# Patient Record
Sex: Female | Born: 1935
Health system: Southern US, Community
[De-identification: ages and names within clinical notes are randomized; demographics above are authoritative.]

## PROBLEM LIST (undated history)

## (undated) DIAGNOSIS — I10 Essential (primary) hypertension: Secondary | ICD-10-CM

## (undated) DIAGNOSIS — E119 Type 2 diabetes mellitus without complications: Secondary | ICD-10-CM

## (undated) DIAGNOSIS — R112 Nausea with vomiting, unspecified: Secondary | ICD-10-CM

## (undated) DIAGNOSIS — Z9889 Other specified postprocedural states: Secondary | ICD-10-CM

## (undated) DIAGNOSIS — H9190 Unspecified hearing loss, unspecified ear: Secondary | ICD-10-CM

## (undated) HISTORY — PX: ABDOMINAL HYSTERECTOMY: SHX81

## (undated) HISTORY — PX: GANGLION CYST EXCISION: SHX1691

## (undated) HISTORY — PX: CHOLECYSTECTOMY: SHX55

---

## 2014-10-11 ENCOUNTER — Ambulatory Visit: Payer: Self-pay | Admitting: Internal Medicine

## 2015-04-12 ENCOUNTER — Encounter: Payer: Self-pay | Admitting: *Deleted

## 2015-04-18 ENCOUNTER — Ambulatory Visit: Payer: Medicare Other | Admitting: Anesthesiology

## 2015-04-18 ENCOUNTER — Encounter: Payer: Self-pay | Admitting: *Deleted

## 2015-04-18 ENCOUNTER — Ambulatory Visit
Admission: RE | Admit: 2015-04-18 | Discharge: 2015-04-18 | Disposition: A | Discharge status: Home / Self Care | Payer: Medicare Other | Source: Ambulatory Visit | Attending: Ophthalmology | Admitting: Ophthalmology

## 2015-04-18 ENCOUNTER — Encounter
Admission: RE | Disposition: A | Discharge status: Home / Self Care | Payer: Self-pay | Source: Ambulatory Visit | Attending: Ophthalmology

## 2015-04-18 DIAGNOSIS — H2511 Age-related nuclear cataract, right eye: Secondary | ICD-10-CM | POA: Diagnosis present

## 2015-04-18 DIAGNOSIS — I10 Essential (primary) hypertension: Secondary | ICD-10-CM | POA: Diagnosis not present

## 2015-04-18 DIAGNOSIS — E119 Type 2 diabetes mellitus without complications: Secondary | ICD-10-CM | POA: Insufficient documentation

## 2015-04-18 DIAGNOSIS — Z7982 Long term (current) use of aspirin: Secondary | ICD-10-CM | POA: Insufficient documentation

## 2015-04-18 HISTORY — DX: Essential (primary) hypertension: I10

## 2015-04-18 HISTORY — DX: Type 2 diabetes mellitus without complications: E11.9

## 2015-04-18 HISTORY — DX: Nausea with vomiting, unspecified: Z98.890

## 2015-04-18 HISTORY — DX: Other specified postprocedural states: R11.2

## 2015-04-18 HISTORY — DX: Unspecified hearing loss, unspecified ear: H91.90

## 2015-04-18 HISTORY — PX: CATARACT EXTRACTION W/PHACO: SHX586

## 2015-04-18 LAB — GLUCOSE, CAPILLARY: Glucose-Capillary: 136 mg/dL — ABNORMAL HIGH (ref 65–99)

## 2015-04-18 SURGERY — PHACOEMULSIFICATION, CATARACT, WITH IOL INSERTION
Anesthesia: Monitor Anesthesia Care | Laterality: Right | Wound class: Clean

## 2015-04-18 MED ORDER — MOXIFLOXACIN HCL 0.5 % OP SOLN
OPHTHALMIC | Status: DC | PRN
  Administered 2015-04-18: 2 [drp] via OPHTHALMIC

## 2015-04-18 MED ORDER — LIDOCAINE HCL (PF) 1 % IJ SOLN
INTRAMUSCULAR | Status: AC
  Filled 2015-04-18: qty 2

## 2015-04-18 MED ORDER — TETRACAINE HCL 0.5 % OP SOLN: 1.0000 [drp] | OPHTHALMIC | Status: DC | PRN

## 2015-04-18 MED ORDER — MOXIFLOXACIN HCL 0.5 % OP SOLN
OPHTHALMIC | Status: AC
  Filled 2015-04-18: qty 3

## 2015-04-18 MED ORDER — ARMC OPHTHALMIC DILATING GEL
1.0000 "application " | OPHTHALMIC | Status: AC | PRN
  Administered 2015-04-18 (×2): 1 via OPHTHALMIC

## 2015-04-18 MED ORDER — NA CHONDROIT SULF-NA HYALURON 40-17 MG/ML IO SOLN
INTRAOCULAR | Status: AC
  Filled 2015-04-18: qty 1

## 2015-04-18 MED ORDER — ARMC OPHTHALMIC DILATING GEL
OPHTHALMIC | Status: AC
  Administered 2015-04-18: 1 via OPHTHALMIC
  Filled 2015-04-18: qty 0.25

## 2015-04-18 MED ORDER — POVIDONE-IODINE 5 % OP SOLN
OPHTHALMIC | Status: AC
  Administered 2015-04-18: 1 via OPHTHALMIC
  Filled 2015-04-18: qty 30

## 2015-04-18 MED ORDER — NA CHONDROIT SULF-NA HYALURON 40-17 MG/ML IO SOLN
INTRAOCULAR | Status: DC | PRN
  Administered 2015-04-18: 1 mL via INTRAOCULAR

## 2015-04-18 MED ORDER — SODIUM CHLORIDE 0.9 % IV SOLN
INTRAVENOUS | Status: DC
  Administered 2015-04-18 (×2): via INTRAVENOUS

## 2015-04-18 MED ORDER — CARBACHOL 0.01 % IO SOLN
INTRAOCULAR | Status: DC | PRN
  Administered 2015-04-18: .5 mL via INTRAOCULAR

## 2015-04-18 MED ORDER — MOXIFLOXACIN HCL 0.5 % OP SOLN: 1.0000 [drp] | OPHTHALMIC | Status: DC | PRN

## 2015-04-18 MED ORDER — EPINEPHRINE HCL 1 MG/ML IJ SOLN
INTRAMUSCULAR | Status: DC | PRN
  Administered 2015-04-18: 250 mL via OPHTHALMIC

## 2015-04-18 MED ORDER — FENTANYL CITRATE (PF) 100 MCG/2ML IJ SOLN
INTRAMUSCULAR | Status: DC | PRN
  Administered 2015-04-18: 25 ug via INTRAVENOUS

## 2015-04-18 MED ORDER — TETRACAINE HCL 0.5 % OP SOLN
OPHTHALMIC | Status: AC
  Filled 2015-04-18: qty 2

## 2015-04-18 MED ORDER — CEFUROXIME OPHTHALMIC INJECTION 1 MG/0.1 ML
INJECTION | OPHTHALMIC | Status: AC
  Filled 2015-04-18: qty 0.1

## 2015-04-18 MED ORDER — CEFUROXIME OPHTHALMIC INJECTION 1 MG/0.1 ML
INJECTION | OPHTHALMIC | Status: DC | PRN
  Administered 2015-04-18: .1 mL via INTRACAMERAL

## 2015-04-18 MED ORDER — EPINEPHRINE HCL 1 MG/ML IJ SOLN
INTRAMUSCULAR | Status: AC
  Filled 2015-04-18: qty 1

## 2015-04-18 MED ORDER — POVIDONE-IODINE 5 % OP SOLN
1.0000 "application " | OPHTHALMIC | Status: AC | PRN
  Administered 2015-04-18: 1 via OPHTHALMIC

## 2015-04-18 SURGICAL SUPPLY — 22 items
CANNULA ANT/CHMB 27GA (MISCELLANEOUS) IMPLANT
CUP MEDICINE 2OZ PLAST GRAD ST (MISCELLANEOUS) IMPLANT
GLOVE BIO SURGEON STRL SZ8 (GLOVE) ×3 IMPLANT
GLOVE BIOGEL M 6.5 STRL (GLOVE) ×3 IMPLANT
GLOVE SURG LX 8.0 MICRO (GLOVE) ×2
GLOVE SURG LX STRL 8.0 MICRO (GLOVE) ×1 IMPLANT
GOWN STRL REUS W/ TWL LRG LVL3 (GOWN DISPOSABLE) ×2 IMPLANT
GOWN STRL REUS W/TWL LRG LVL3 (GOWN DISPOSABLE) ×4
LENS IOL TECNIS 22.5 (Intraocular Lens) ×3 IMPLANT
LENS IOL TECNIS MONO 1P 22.5 (Intraocular Lens) ×1 IMPLANT
PACK CATARACT (MISCELLANEOUS) ×3 IMPLANT
PACK CATARACT BRASINGTON LX (MISCELLANEOUS) ×3 IMPLANT
PACK EYE AFTER SURG (MISCELLANEOUS) ×3 IMPLANT
SOL BSS BAG (MISCELLANEOUS) ×3
SOL PREP PVP 2OZ (MISCELLANEOUS)
SOLUTION BSS BAG (MISCELLANEOUS) ×1 IMPLANT
SOLUTION PREP PVP 2OZ (MISCELLANEOUS) IMPLANT
SYR 3ML LL SCALE MARK (SYRINGE) ×3 IMPLANT
SYR 5ML LL (SYRINGE) ×3 IMPLANT
SYR TB 1ML 27GX1/2 LL (SYRINGE) ×3 IMPLANT
WATER STERILE IRR 1000ML POUR (IV SOLUTION) ×3 IMPLANT
WIPE NON LINTING 3.25X3.25 (MISCELLANEOUS) ×3 IMPLANT

## 2015-04-18 NOTE — Op Note (Signed)
PREOPERATIVE DIAGNOSIS:  Nuclear sclerotic cataract of the right eye.   POSTOPERATIVE DIAGNOSIS: NUCLEAR SCLEROTIC CATARACT RIGHT EYE   OPERATIVE PROCEDURE:  Procedure(s): CATARACT EXTRACTION PHACO AND INTRAOCULAR LENS PLACEMENT (IOC)   SURGEON:  Galen Manila, MD.   ANESTHESIA:  Anesthesiologist: Gijsbertus Georgana Curio, MD CRNA: Malva Cogan, CRNA; Chong Sicilian, CRNA  1.      Managed anesthesia care. 2.      Topical tetracaine drops followed by 2% Xylocaine jelly applied in the preoperative holding area.   COMPLICATIONS:  None.   TECHNIQUE:   Stop and chop   DESCRIPTION OF PROCEDURE:  The patient was examined and consented in the preoperative holding area where the aforementioned topical anesthesia was applied to the right eye and then brought back to the Operating Room where the right eye was prepped and draped in the usual sterile ophthalmic fashion and a lid speculum was placed. A paracentesis was created with the side port blade and the anterior chamber was filled with viscoelastic. A near clear corneal incision was performed with the steel keratome. A continuous curvilinear capsulorrhexis was performed with a cystotome followed by the capsulorrhexis forceps. Hydrodissection and hydrodelineation were carried out with BSS on a blunt cannula. The lens was removed in a stop and chop  technique and the remaining cortical material was removed with the irrigation-aspiration handpiece. The capsular bag was inflated with viscoelastic and the Technis ZCB00  lens was placed in the capsular bag without complication. The remaining viscoelastic was removed from the eye with the irrigation-aspiration handpiece. The wounds were hydrated. The anterior chamber was flushed with Miostat and the eye was inflated to physiologic pressure. 0.1 mL of cefuroxime concentration 10 mg/mL was placed in the anterior chamber. The wounds were found to be water tight. The eye was dressed with Vigamox. The patient  was given protective glasses to wear throughout the day and a shield with which to sleep tonight. The patient was also given drops with which to begin a drop regimen today and will follow-up with me in one day.  Implant Name Type Inv. Item Serial No. Manufacturer Lot No. LRB No. Used  LENS IMPL INTRAOC ZCB00 22.5 - [id number] Intraocular Lens LENS IMPL INTRAOC ZCB00 22.5 [id number]   Right 1   Procedure(s) with comments: CATARACT EXTRACTION PHACO AND INTRAOCULAR LENS PLACEMENT (IOC) (Right) - [place]   00:58.9 AP    24.1 CDE   14.19 casette lot#  Electronically signed: PORFILIO,WILLIAM LOUIS 04/18/2015 9:14 AM

## 2015-04-18 NOTE — Discharge Instructions (Signed)
Eye Surgery Discharge Instructions  Expect mild scratchy sensation or mild soreness. DO NOT RUB YOUR EYE!  The day of surgery:  Minimal physical activity, but bed rest is not required  No reading, computer work, or close hand work  No bending, lifting, or straining.  May watch TV  For 24 hours:  No driving, legal decisions, or alcoholic beverages  Safety precautions  Eat anything you prefer: It is better to start with liquids, then soup then solid foods.  _____ Eye patch should be worn until postoperative exam tomorrow.  ____ Solar shield eyeglasses should be worn for comfort in the sunlight/patch while sleeping  Resume all regular medications including aspirin or Coumadin if these were discontinued prior to surgery. You may shower, bathe, shave, or wash your hair. Tylenol may be taken for mild discomfort.  Call your doctor if you experience significant pain, nausea, or vomiting, fever > 101 or other signs of infection. [phone] or [phone] Specific instructions:  Follow-up Information    Follow up with Carlena Bjornstad, MD On 04/19/2015.   Specialty:  Ophthalmology   Why:  10:35   Contact information:   [address] [place] [address] [phone]

## 2015-04-18 NOTE — Anesthesia Preprocedure Evaluation (Signed)
Anesthesia Evaluation  Patient identified by MRN, date of birth, ID band Patient awake    Reviewed: Allergy & Precautions, NPO status , Patient's Chart, lab work & pertinent test results  Airway Mallampati: III       Dental no notable dental hx.    Pulmonary neg pulmonary ROS,    Pulmonary exam normal        Cardiovascular hypertension, Pt. on home beta blockers Normal cardiovascular exam     Neuro/Psych negative neurological ROS  negative psych ROS   GI/Hepatic negative GI ROS, Neg liver ROS,   Endo/Other  diabetes, Type 2, Oral Hypoglycemic Agents  Renal/GU negative Renal ROS     Musculoskeletal   Abdominal Normal abdominal exam  (+)   Peds  Hematology   Anesthesia Other Findings   Reproductive/Obstetrics                             Anesthesia Physical Anesthesia Plan  ASA: III  Anesthesia Plan: MAC   Post-op Pain Management:    Induction: Intravenous  Airway Management Planned: Nasal Cannula  Additional Equipment:   Intra-op Plan:   Post-operative Plan:   Informed Consent: I have reviewed the patients History and Physical, chart, labs and discussed the procedure including the risks, benefits and alternatives for the proposed anesthesia with the patient or authorized representative who has indicated his/her understanding and acceptance.     Plan Discussed with: CRNA  Anesthesia Plan Comments:         Anesthesia Quick Evaluation

## 2015-04-18 NOTE — Transfer of Care (Signed)
Immediate Anesthesia Transfer of Care Note  Patient: Amy Aguirre  Procedure(s) Performed: Procedure(s) with comments: CATARACT EXTRACTION PHACO AND INTRAOCULAR LENS PLACEMENT (IOC) (Right) - [place]   00:58.9 AP    24.1 CDE   14.19 casette lot#  Patient Location: Short Stay  Anesthesia Type:MAC  Level of Consciousness: awake, alert  and oriented  Airway & Oxygen Therapy: Patient Spontanous Breathing and Patient connected to nasal cannula oxygen  Post-op Assessment: Report given to RN and Post -op Vital signs reviewed and stable  Post vital signs: Reviewed and stable  Last Vitals: 100% 60hr 97.8 177/83 18resp Filed Vitals:   04/18/15 0743  BP: 209/68  Pulse: 60  Temp: 36.4 C  Resp: 16    Complications: No apparent anesthesia complications

## 2015-04-18 NOTE — Anesthesia Postprocedure Evaluation (Signed)
  Anesthesia Post-op Note  Patient: Amy Aguirre  Procedure(s) Performed: Procedure(s) with comments: CATARACT EXTRACTION PHACO AND INTRAOCULAR LENS PLACEMENT (IOC) (Right) - [place]   00:58.9 AP    24.1 CDE   14.19 casette lot#  Anesthesia type:MAC  Patient location: short stay  Post pain: Pain level controlled  Post assessment: Post-op Vital signs reviewed, Patient's Cardiovascular Status Stable, Respiratory Function Stable, Patent Airway and No signs of Nausea or vomiting  Post vital signs: Reviewed and stable  Last Vitals: 100% 18resp 97.8 177/83 60hr Filed Vitals:   04/18/15 0743  BP: 209/68  Pulse: 60  Temp: 36.4 C  Resp: 16    Level of consciousness: awake, alert  and patient cooperative  Complications: No apparent anesthesia complications

## 2015-04-18 NOTE — H&P (Signed)
  All labs reviewed. Abnormal studies sent to patients PCP when indicated.  Previous H&P reviewed, patient examined, there are NO CHANGES.  PORFILIO,WILLIAM LOUIS9/20/20168:46 AM

## 2017-11-10 ENCOUNTER — Inpatient Hospital Stay
Admission: AD | Admit: 2017-11-10 | Payer: Self-pay | Source: Other Acute Inpatient Hospital | Admitting: General Surgery

## 2017-11-10 ENCOUNTER — Observation Stay (HOSPITAL_COMMUNITY)
Admission: EM | Admit: 2017-11-10 | Discharge: 2017-11-13 | Disposition: A | Discharge status: Home / Self Care | Payer: Medicare Other | Attending: Physician Assistant | Admitting: Physician Assistant

## 2017-11-10 ENCOUNTER — Other Ambulatory Visit: Payer: Self-pay

## 2017-11-10 ENCOUNTER — Emergency Department
Admission: EM | Admit: 2017-11-10 | Discharge: 2017-11-10 | Disposition: A | Discharge status: Other Acute Inpatient Hospital | Payer: Medicare Other | Attending: Emergency Medicine | Admitting: Emergency Medicine

## 2017-11-10 ENCOUNTER — Emergency Department: Payer: Medicare Other

## 2017-11-10 ENCOUNTER — Encounter: Payer: Self-pay | Admitting: Medical Oncology

## 2017-11-10 DIAGNOSIS — H8110 Benign paroxysmal vertigo, unspecified ear: Secondary | ICD-10-CM | POA: Diagnosis not present

## 2017-11-10 DIAGNOSIS — E119 Type 2 diabetes mellitus without complications: Secondary | ICD-10-CM | POA: Diagnosis not present

## 2017-11-10 DIAGNOSIS — Z9841 Cataract extraction status, right eye: Secondary | ICD-10-CM | POA: Diagnosis not present

## 2017-11-10 DIAGNOSIS — H9190 Unspecified hearing loss, unspecified ear: Secondary | ICD-10-CM | POA: Insufficient documentation

## 2017-11-10 DIAGNOSIS — Y998 Other external cause status: Secondary | ICD-10-CM | POA: Insufficient documentation

## 2017-11-10 DIAGNOSIS — S0211HA Other fracture of occiput, left side, initial encounter for closed fracture: Secondary | ICD-10-CM | POA: Diagnosis not present

## 2017-11-10 DIAGNOSIS — Z7982 Long term (current) use of aspirin: Secondary | ICD-10-CM | POA: Insufficient documentation

## 2017-11-10 DIAGNOSIS — R2681 Unsteadiness on feet: Secondary | ICD-10-CM | POA: Insufficient documentation

## 2017-11-10 DIAGNOSIS — Z79899 Other long term (current) drug therapy: Secondary | ICD-10-CM | POA: Insufficient documentation

## 2017-11-10 DIAGNOSIS — Z7984 Long term (current) use of oral hypoglycemic drugs: Secondary | ICD-10-CM | POA: Insufficient documentation

## 2017-11-10 DIAGNOSIS — Y9289 Other specified places as the place of occurrence of the external cause: Secondary | ICD-10-CM | POA: Diagnosis not present

## 2017-11-10 DIAGNOSIS — R269 Unspecified abnormalities of gait and mobility: Secondary | ICD-10-CM | POA: Insufficient documentation

## 2017-11-10 DIAGNOSIS — M4050 Lordosis, unspecified, site unspecified: Secondary | ICD-10-CM | POA: Diagnosis not present

## 2017-11-10 DIAGNOSIS — S065X0A Traumatic subdural hemorrhage without loss of consciousness, initial encounter: Secondary | ICD-10-CM | POA: Insufficient documentation

## 2017-11-10 DIAGNOSIS — Z9049 Acquired absence of other specified parts of digestive tract: Secondary | ICD-10-CM | POA: Insufficient documentation

## 2017-11-10 DIAGNOSIS — W1839XA Other fall on same level, initial encounter: Secondary | ICD-10-CM | POA: Diagnosis not present

## 2017-11-10 DIAGNOSIS — Z90710 Acquired absence of both cervix and uterus: Secondary | ICD-10-CM | POA: Insufficient documentation

## 2017-11-10 DIAGNOSIS — I10 Essential (primary) hypertension: Secondary | ICD-10-CM | POA: Insufficient documentation

## 2017-11-10 DIAGNOSIS — H547 Unspecified visual loss: Secondary | ICD-10-CM | POA: Insufficient documentation

## 2017-11-10 DIAGNOSIS — Y929 Unspecified place or not applicable: Secondary | ICD-10-CM | POA: Insufficient documentation

## 2017-11-10 DIAGNOSIS — Y9389 Activity, other specified: Secondary | ICD-10-CM | POA: Insufficient documentation

## 2017-11-10 DIAGNOSIS — W19XXXD Unspecified fall, subsequent encounter: Secondary | ICD-10-CM

## 2017-11-10 DIAGNOSIS — S065X9A Traumatic subdural hemorrhage with loss of consciousness of unspecified duration, initial encounter: Secondary | ICD-10-CM | POA: Diagnosis present

## 2017-11-10 DIAGNOSIS — I6200 Nontraumatic subdural hemorrhage, unspecified: Secondary | ICD-10-CM

## 2017-11-10 DIAGNOSIS — I951 Orthostatic hypotension: Secondary | ICD-10-CM | POA: Insufficient documentation

## 2017-11-10 DIAGNOSIS — S098XXA Other specified injuries of head, initial encounter: Secondary | ICD-10-CM | POA: Diagnosis present

## 2017-11-10 DIAGNOSIS — W19XXXA Unspecified fall, initial encounter: Secondary | ICD-10-CM | POA: Diagnosis present

## 2017-11-10 DIAGNOSIS — S066X0A Traumatic subarachnoid hemorrhage without loss of consciousness, initial encounter: Secondary | ICD-10-CM | POA: Insufficient documentation

## 2017-11-10 DIAGNOSIS — Z961 Presence of intraocular lens: Secondary | ICD-10-CM | POA: Diagnosis not present

## 2017-11-10 DIAGNOSIS — Y999 Unspecified external cause status: Secondary | ICD-10-CM | POA: Insufficient documentation

## 2017-11-10 DIAGNOSIS — S02119A Unspecified fracture of occiput, initial encounter for closed fracture: Secondary | ICD-10-CM | POA: Diagnosis not present

## 2017-11-10 DIAGNOSIS — S065XAA Traumatic subdural hemorrhage with loss of consciousness status unknown, initial encounter: Secondary | ICD-10-CM

## 2017-11-10 LAB — BASIC METABOLIC PANEL
Anion gap: 12 (ref 5–15)
BUN: 26 mg/dL — AB (ref 6–20)
CALCIUM: 9.6 mg/dL (ref 8.9–10.3)
CO2: 20 mmol/L — AB (ref 22–32)
Chloride: 105 mmol/L (ref 101–111)
Creatinine, Ser: 1.23 mg/dL — ABNORMAL HIGH (ref 0.44–1.00)
GFR calc non Af Amer: 37 mL/min — ABNORMAL LOW (ref >60)
GFR, EST AFRICAN AMERICAN: 43 mL/min — AB (ref >60)
GLUCOSE: 196 mg/dL — AB (ref 65–99)
Potassium: 3.7 mmol/L (ref 3.5–5.1)
Sodium: 137 mmol/L (ref 135–145)

## 2017-11-10 LAB — CBC WITH DIFFERENTIAL/PLATELET
BASOS PCT: 0 %
Basophils Absolute: 0 10*3/uL (ref 0–0.1)
EOS ABS: 0 10*3/uL (ref 0–0.7)
EOS PCT: 0 %
HCT: 33.4 % — ABNORMAL LOW (ref 35.0–47.0)
Hemoglobin: 11.2 g/dL — ABNORMAL LOW (ref 12.0–16.0)
Lymphocytes Relative: 12 %
Lymphs Abs: 1.3 10*3/uL (ref 1.0–3.6)
MCH: 34 pg (ref 26.0–34.0)
MCHC: 33.5 g/dL (ref 32.0–36.0)
MCV: 101.4 fL — ABNORMAL HIGH (ref 80.0–100.0)
MONO ABS: 0.5 10*3/uL (ref 0.2–0.9)
MONOS PCT: 4 %
Neutro Abs: 9.5 10*3/uL — ABNORMAL HIGH (ref 1.4–6.5)
Neutrophils Relative %: 84 %
Platelets: 213 10*3/uL (ref 150–440)
RBC: 3.29 MIL/uL — ABNORMAL LOW (ref 3.80–5.20)
RDW: 14 % (ref 11.5–14.5)
WBC: 11.3 10*3/uL — ABNORMAL HIGH (ref 3.6–11.0)

## 2017-11-10 LAB — TYPE AND SCREEN
ABO/RH(D): B POS
Antibody Screen: NEGATIVE

## 2017-11-10 LAB — GLUCOSE, CAPILLARY: GLUCOSE-CAPILLARY: 178 mg/dL — AB (ref 65–99)

## 2017-11-10 LAB — PROTIME-INR
INR: 1.09
PROTHROMBIN TIME: 14 s (ref 11.4–15.2)

## 2017-11-10 MED ORDER — ONDANSETRON HCL 4 MG/2ML IJ SOLN
4.0000 mg | Freq: Once | INTRAMUSCULAR | Status: AC
  Administered 2017-11-10: 4 mg via INTRAVENOUS

## 2017-11-10 MED ORDER — LABETALOL HCL 5 MG/ML IV SOLN
10.0000 mg | Freq: Once | INTRAVENOUS | Status: DC
  Filled 2017-11-10: qty 4

## 2017-11-10 MED ORDER — INDAPAMIDE 2.5 MG PO TABS
2.5000 mg | ORAL_TABLET | Freq: Every day | ORAL | Status: DC
  Administered 2017-11-11 – 2017-11-13 (×3): 2.5 mg via ORAL
  Filled 2017-11-10 (×3): qty 1

## 2017-11-10 MED ORDER — HYDRALAZINE HCL 20 MG/ML IJ SOLN
10.0000 mg | INTRAMUSCULAR | Status: DC | PRN
  Administered 2017-11-11: 10 mg via INTRAVENOUS
  Filled 2017-11-10 (×2): qty 1

## 2017-11-10 MED ORDER — GLIMEPIRIDE 2 MG PO TABS
2.0000 mg | ORAL_TABLET | ORAL | Status: DC
  Administered 2017-11-12: 2 mg via ORAL
  Filled 2017-11-10 (×2): qty 1

## 2017-11-10 MED ORDER — POTASSIUM CHLORIDE CRYS ER 10 MEQ PO TBCR
10.0000 meq | EXTENDED_RELEASE_TABLET | Freq: Every day | ORAL | Status: DC
  Administered 2017-11-11 – 2017-11-13 (×3): 10 meq via ORAL
  Filled 2017-11-10 (×3): qty 1

## 2017-11-10 MED ORDER — IRBESARTAN 150 MG PO TABS
150.0000 mg | ORAL_TABLET | Freq: Every day | ORAL | Status: DC
  Administered 2017-11-11 – 2017-11-13 (×3): 150 mg via ORAL
  Filled 2017-11-10 (×3): qty 1

## 2017-11-10 MED ORDER — ONDANSETRON HCL 4 MG/2ML IJ SOLN
INTRAMUSCULAR | Status: AC
  Filled 2017-11-10: qty 2

## 2017-11-10 MED ORDER — ATENOLOL 100 MG PO TABS
100.0000 mg | ORAL_TABLET | Freq: Every day | ORAL | Status: DC
  Administered 2017-11-11 – 2017-11-13 (×3): 100 mg via ORAL
  Filled 2017-11-10 (×2): qty 1
  Filled 2017-11-10: qty 2

## 2017-11-10 MED ORDER — INSULIN ASPART 100 UNIT/ML ~~LOC~~ SOLN
0.0000 [IU] | Freq: Three times a day (TID) | SUBCUTANEOUS | Status: DC
  Administered 2017-11-11 – 2017-11-12 (×2): 2 [IU] via SUBCUTANEOUS
  Administered 2017-11-13: 3 [IU] via SUBCUTANEOUS

## 2017-11-10 MED ORDER — SIMVASTATIN 40 MG PO TABS
40.0000 mg | ORAL_TABLET | Freq: Every day | ORAL | Status: DC
  Administered 2017-11-11 – 2017-11-13 (×3): 40 mg via ORAL
  Filled 2017-11-10 (×3): qty 1

## 2017-11-10 MED ORDER — SODIUM CHLORIDE 0.9 % IV SOLN
1000.0000 mg | Freq: Once | INTRAVENOUS | Status: AC
  Administered 2017-11-10: 1000 mg via INTRAVENOUS
  Filled 2017-11-10: qty 10

## 2017-11-10 MED ORDER — DOCUSATE SODIUM 100 MG PO CAPS
100.0000 mg | ORAL_CAPSULE | Freq: Two times a day (BID) | ORAL | Status: DC
  Administered 2017-11-11 – 2017-11-13 (×5): 100 mg via ORAL
  Filled 2017-11-10 (×5): qty 1

## 2017-11-10 MED ORDER — ACETAMINOPHEN 325 MG PO TABS
650.0000 mg | ORAL_TABLET | ORAL | Status: DC | PRN
  Administered 2017-11-11: 650 mg via ORAL
  Filled 2017-11-10: qty 2

## 2017-11-10 MED ORDER — ONDANSETRON 4 MG PO TBDP: 4.0000 mg | ORAL_TABLET | Freq: Four times a day (QID) | ORAL | Status: DC | PRN

## 2017-11-10 MED ORDER — MORPHINE SULFATE (PF) 4 MG/ML IV SOLN: 1.0000 mg | INTRAVENOUS | Status: DC | PRN

## 2017-11-10 MED ORDER — ONDANSETRON HCL 4 MG/2ML IJ SOLN
4.0000 mg | Freq: Four times a day (QID) | INTRAMUSCULAR | Status: DC | PRN
  Administered 2017-11-11: 4 mg via INTRAVENOUS
  Filled 2017-11-10: qty 2

## 2017-11-10 MED ORDER — TRAMADOL HCL 50 MG PO TABS: 50.0000 mg | ORAL_TABLET | Freq: Two times a day (BID) | ORAL | Status: DC | PRN

## 2017-11-10 NOTE — H&P (Signed)
History   Amy Aguirre is an 84 y.o. female.   Chief Complaint:  Chief Complaint  Patient presents with  . Head Injury    Pt is a lovely 84 yo F who fell at home today trying to pick up a bucket.  She knows she didn't stand all the way back up, but doesn't recall losing her balance or getting dizzy.  She does know that she fell backwards and hit her elbows and the back of her head.  She did not pass out.  She does not feel dizzy now. She denies visual changes. She does have a headache "across her eyes."  She denies nausea or vomiting.  She denies chest pain or shortness of breath. She has significant hypertension.  She was taken to the [facility] ED by her daughter.  She was found to have a skull fx and head bleed.  One of her other daughters works here and she desired transfer to  [facility].     Past Medical History:  Diagnosis Date  . Diabetes mellitus without complication ([place])   . HOH (hard of hearing)   . Hypertension   . PONV (postoperative nausea and vomiting)     Past Surgical History:  Procedure Laterality Date  . ABDOMINAL HYSTERECTOMY    . CATARACT EXTRACTION W/PHACO Right 04/18/2015   Procedure: CATARACT EXTRACTION PHACO AND INTRAOCULAR LENS PLACEMENT (IOC);  Surgeon: Birder Robson, MD;  Location: [facility] ORS;  Service: Ophthalmology;  Laterality: Right;  [place]   00:58.9 AP    24.1 CDE   14.19 casette lot#  . CHOLECYSTECTOMY    . GANGLION CYST EXCISION      No family history on file. Social History:  reports that she has never smoked. She does not have any smokeless tobacco history on file. She reports that she does not drink alcohol. Her drug history is not on file.  Allergies  No Known Allergies  Home Medications   (Not in a hospital admission)  Trauma Course   Results for orders placed or performed during the hospital encounter of 11/10/17 (from the past 48 hour(s))  Basic metabolic panel     Status: Abnormal   Collection Time: 11/10/17  3:17 PM  Result Value Ref  Range   Sodium 137 135 - 145 mmol/L   Potassium 3.7 3.5 - 5.1 mmol/L   Chloride 105 101 - 111 mmol/L   CO2 20 (L) 22 - 32 mmol/L   Glucose, Bld 196 (H) 65 - 99 mg/dL   BUN 26 (H) 6 - 20 mg/dL   Creatinine, Ser 1.23 (H) 0.44 - 1.00 mg/dL   Calcium 9.6 8.9 - 10.3 mg/dL   GFR calc non Af Amer 37 (L) >60 mL/min   GFR calc Af Amer 43 (L) >60 mL/min    Comment: (NOTE) The eGFR has been calculated using the CKD EPI equation. This calculation has not been validated in all clinical situations. eGFR's persistently <60 mL/min signify possible Chronic Kidney Disease.    Anion gap 12 5 - 15    Comment: Performed at [facility], [address] [place], [place] [address]  CBC with Differential     Status: Abnormal   Collection Time: 11/10/17  3:17 PM  Result Value Ref Range   WBC 11.3 (H) 3.6 - 11.0 K/uL   RBC 3.29 (L) 3.80 - 5.20 MIL/uL   Hemoglobin 11.2 (L) 12.0 - 16.0 g/dL   HCT 33.4 (L) 35.0 - 47.0 %   MCV 101.4 (H) 80.0 -  100.0 fL   MCH 34.0 26.0 - 34.0 pg   MCHC 33.5 32.0 - 36.0 g/dL   RDW 14.0 11.5 - 14.5 %   Platelets 213 150 - 440 K/uL   Neutrophils Relative % 84 %   Neutro Abs 9.5 (H) 1.4 - 6.5 K/uL   Lymphocytes Relative 12 %   Lymphs Abs 1.3 1.0 - 3.6 K/uL   Monocytes Relative 4 %   Monocytes Absolute 0.5 0.2 - 0.9 K/uL   Eosinophils Relative 0 %   Eosinophils Absolute 0.0 0 - 0.7 K/uL   Basophils Relative 0 %   Basophils Absolute 0.0 0 - 0.1 K/uL    Comment: Performed at [facility], [address] [place], [place] [address]  Protime-INR     Status: None   Collection Time: 11/10/17  3:17 PM  Result Value Ref Range   Prothrombin Time 14.0 11.4 - 15.2 seconds   INR 1.09     Comment: Performed at [facility], [address] [place], [place] [address]  Type and screen Ordered by PROVIDER DEFAULT     Status: None   Collection Time: 11/10/17  4:08 PM  Result Value Ref Range   ABO/RH(D) B POS    Antibody Screen NEG    Sample Expiration       11/13/2017 Performed at [facility], [address] [place], [place] [address]    Ct Head Wo Contrast  Result Date: 11/10/2017 CLINICAL DATA:  [place] and struck head. EXAM: CT HEAD WITHOUT CONTRAST CT CERVICAL SPINE WITHOUT CONTRAST TECHNIQUE: Multidetector CT imaging of the head and cervical spine was performed following the standard protocol without intravenous contrast. Multiplanar CT image reconstructions of the cervical spine were also generated. COMPARISON:  None. FINDINGS: CT HEAD FINDINGS Brain: A right frontoparietal acute to subacute subdural hematoma measures 3 mm in thickness and is associated with acute subarachnoid hemorrhage in the right temporal lobe. Low attenuation in the inferior right temporal lobe may be related to contusion. There is also low attenuation and subarachnoid hemorrhage in the posterior left cerebellum suggesting contusion. No substantial midline shift. No hydrocephalus. Atrophy appears age-appropriate. No focal or discrete intra-axial mass lesion evident. Vascular: Atherosclerotic calcification of the carotid siphons noted at the skull base. No dense MCA sign. Skull: Left occipital skull fracture extends from the foramen magnum up almost to the apex of the lambdoid suture. Sinuses/Orbits: The visualized paranasal sinuses and mastoid air cells are clear. Visualized portions of the globes and intraorbital fat are unremarkable. Other: Large left posterior parietooccipital scalp contusion evident. CT CERVICAL SPINE FINDINGS Alignment: Straightening of normal cervical lordosis. Skull base and vertebrae: No acute fracture. No primary bone lesion or focal pathologic process. Soft tissues and spinal canal: No prevertebral fluid or swelling. Acute hemorrhage is visible in the CSF space dependently at the level of the foramen magnum, tracking down into the craniocervical junction. Disc levels: Loss of disc height noted at C5-6. Trace anterolisthesis of C4 on 5 and C6 on 7  is compatible with the facet degeneration at these levels. Upper chest: Unremarkable. Other: None. IMPRESSION: 1. Small right frontal parietal acute to subacute subdural hematoma associated with subarachnoid hemorrhage and probable contusion in the inferior right temporal lobe. There is associated subarachnoid hemorrhage/contusion in the posterior left cerebellum with blood products layering dependently in the CSF space of the craniocervical junction. No substantial midline shift. 2. Nondisplaced left occipital skull fracture. 3. Degenerative changes in the cervical spine without fracture. 4. Loss of  cervical lordosis. This can be related to patient positioning, muscle spasm or soft tissue injury. 5. Posterior left parietooccipital scalp contusion. Critical Value/emergent results were called by telephone at the time of interpretation on 11/10/2017 at 3:19 pm to Dr. Toney Rakes, who verbally acknowledged these results. Electronically Signed   By: Misty Stanley M.D.   On: 11/10/2017 15:21   Ct Cervical Spine Wo Contrast  Result Date: 11/10/2017 CLINICAL DATA:  [place] and struck head. EXAM: CT HEAD WITHOUT CONTRAST CT CERVICAL SPINE WITHOUT CONTRAST TECHNIQUE: Multidetector CT imaging of the head and cervical spine was performed following the standard protocol without intravenous contrast. Multiplanar CT image reconstructions of the cervical spine were also generated. COMPARISON:  None. FINDINGS: CT HEAD FINDINGS Brain: A right frontoparietal acute to subacute subdural hematoma measures 3 mm in thickness and is associated with acute subarachnoid hemorrhage in the right temporal lobe. Low attenuation in the inferior right temporal lobe may be related to contusion. There is also low attenuation and subarachnoid hemorrhage in the posterior left cerebellum suggesting contusion. No substantial midline shift. No hydrocephalus. Atrophy appears age-appropriate. No focal or discrete intra-axial mass lesion evident. Vascular:  Atherosclerotic calcification of the carotid siphons noted at the skull base. No dense MCA sign. Skull: Left occipital skull fracture extends from the foramen magnum up almost to the apex of the lambdoid suture. Sinuses/Orbits: The visualized paranasal sinuses and mastoid air cells are clear. Visualized portions of the globes and intraorbital fat are unremarkable. Other: Large left posterior parietooccipital scalp contusion evident. CT CERVICAL SPINE FINDINGS Alignment: Straightening of normal cervical lordosis. Skull base and vertebrae: No acute fracture. No primary bone lesion or focal pathologic process. Soft tissues and spinal canal: No prevertebral fluid or swelling. Acute hemorrhage is visible in the CSF space dependently at the level of the foramen magnum, tracking down into the craniocervical junction. Disc levels: Loss of disc height noted at C5-6. Trace anterolisthesis of C4 on 5 and C6 on 7 is compatible with the facet degeneration at these levels. Upper chest: Unremarkable. Other: None. IMPRESSION: 1. Small right frontal parietal acute to subacute subdural hematoma associated with subarachnoid hemorrhage and probable contusion in the inferior right temporal lobe. There is associated subarachnoid hemorrhage/contusion in the posterior left cerebellum with blood products layering dependently in the CSF space of the craniocervical junction. No substantial midline shift. 2. Nondisplaced left occipital skull fracture. 3. Degenerative changes in the cervical spine without fracture. 4. Loss of cervical lordosis. This can be related to patient positioning, muscle spasm or soft tissue injury. 5. Posterior left parietooccipital scalp contusion. Critical Value/emergent results were called by telephone at the time of interpretation on 11/10/2017 at 3:19 pm to Dr. Toney Rakes, who verbally acknowledged these results. Electronically Signed   By: Misty Stanley M.D.   On: 11/10/2017 15:21    Review of Systems   Constitutional: Negative.   HENT: Negative.   Eyes: Negative.   Respiratory: Negative.   Cardiovascular: Negative.        High blood pressure  Gastrointestinal: Negative.   Genitourinary: Negative.   Musculoskeletal:       Elbows are sore  Skin: Negative.   Neurological: Positive for headaches.  Endo/Heme/Allergies: Negative.   Psychiatric/Behavioral: Negative.   All other systems reviewed and are negative.   Blood pressure (!) 183/77, pulse 80, temperature 98.4 F (36.9 C), temperature source Oral, SpO2 100 %. Physical Exam  Constitutional: She is oriented to person, place, and time. She appears well-developed and well-nourished. No distress.  HENT:  Head: Normocephalic.  Right Ear: External ear normal.  Left Ear: External ear normal.  Mouth/Throat: Oropharynx is clear and moist.  Small hematoma on left posterior scalp  Eyes: Pupils are equal, round, and reactive to light. Conjunctivae and EOM are normal. Right eye exhibits no discharge. Left eye exhibits no discharge. No scleral icterus.  Neck: Normal range of motion. Neck supple. No JVD present. No tracheal deviation present. No thyromegaly present.  Cardiovascular: Normal rate, regular rhythm, normal heart sounds and intact distal pulses. Exam reveals no gallop and no friction rub.  No murmur heard. Respiratory: Effort normal and breath sounds normal. No respiratory distress. She has no wheezes. She has no rales. She exhibits no tenderness.  GI: Soft. Bowel sounds are normal. She exhibits no distension. There is no tenderness. There is no rebound and no guarding.  Musculoskeletal: Normal range of motion. She exhibits tenderness (superficial bruising and abrasion on bilateral elbows) and deformity (heberden's nodes c/w OA of hands.). She exhibits no edema.  Lymphadenopathy:    She has no cervical adenopathy.  Neurological: She is alert and oriented to person, place, and time. She displays normal reflexes. No cranial nerve  deficit (other than CN VIII). Coordination normal.  Hard of hearing  Skin: Skin is warm and dry. No rash noted. She is not diaphoretic. No erythema. No pallor.  Psychiatric: She has a normal mood and affect. Her behavior is normal. Judgment and thought content normal.     Assessment/Plan Fall Occipital skull fracture Small SDH right Small SAH Left scalp contusion HTN DM  Admit for observation in 4N ICU or 4N stepdown Neuro checks Diabetic diet, home amaryl, SSI. Recheck labs in AM Permissive slight hypertension Most of home meds Probably home tomorrow after therapies.   Neurosurg consult.  Stark Klein 11/10/2017, 8:12 PM   Procedures

## 2017-11-10 NOTE — ED Notes (Signed)
EMTALA reviewed. 

## 2017-11-10 NOTE — ED Notes (Signed)
Pt moved to room 6  Report called to [place] RN

## 2017-11-10 NOTE — ED Triage Notes (Signed)
Pt reports she tripped and fell today and hit the back of her head. Pt denies LOC. Denies use of blood thinner. A/O x 4. Ambulatory.

## 2017-11-10 NOTE — ED Notes (Signed)
[facility]  CALLED  FOR  TRANSFER 

## 2017-11-10 NOTE — ED Triage Notes (Signed)
Patient arrived via [facility]. Subarrrachnoid bleed reported with occipital fracture. Complaints of dull ache in left forearm. EMS provided Labetelol 10mg  in route. Keppra IV and zofran provided at [facility].

## 2017-11-10 NOTE — ED Notes (Signed)
ED Provider at bedside. 

## 2017-11-10 NOTE — ED Notes (Addendum)
See triage note  States she bent down to get something and fell back   Hit head on patio Large hematoma noted to back of head   Denies any LOC she is alert and oriented   Also has bruising and abrasion to left elbow  Family at bedside

## 2017-11-10 NOTE — Progress Notes (Signed)
Patient ID: Amy NovakDorene Aguirre, female   DOB: 01/07/1925, 84 y.o.   MRN: [id number] Appreciate Dr. Lindalou HosePool's input. I spoke with her family.  Violeta GelinasBurke Yameli Delamater, MD, MPH, FACS Trauma: [phone] General Surgery: [phone]

## 2017-11-10 NOTE — ED Provider Notes (Signed)
[facility] Emergency Department Provider Note ____________________________________________   First MD Initiated Contact with Patient 11/10/17 1517     (approximate)  I have reviewed the triage vital signs and the nursing notes.   HISTORY  Chief Complaint Head Injury    HPI Amy Aguirre is a 84 y.o. female with PMH as noted below who presents with head injury, acute onset when the patient was bending down to get something off the ground and fell backwards, associated with swelling to the back of her head, as well as with some nausea after she arrived in the ED.  Patient reports pain to the back of her head and pressure behind her eyes.  She denies LOC, vision changes, weakness or numbness, or vomiting.  No change in mental status.   Past Medical History:  Diagnosis Date  . Diabetes mellitus without complication (HCC)   . HOH (hard of hearing)   . Hypertension   . PONV (postoperative nausea and vomiting)     There are no active problems to display for this patient.   Past Surgical History:  Procedure Laterality Date  . ABDOMINAL HYSTERECTOMY    . CATARACT EXTRACTION W/PHACO Right 04/18/2015   Procedure: CATARACT EXTRACTION PHACO AND INTRAOCULAR LENS PLACEMENT (IOC);  Surgeon: Galen Manila, MD;  Location: [facility] ORS;  Service: Ophthalmology;  Laterality: Right;  [place]   00:58.9 AP    24.1 CDE   14.19 casette lot#  . CHOLECYSTECTOMY    . GANGLION CYST EXCISION      Prior to Admission medications   Medication Sig Start Date End Date Taking? Authorizing Provider  aspirin 81 MG tablet Take 81 mg by mouth daily.    [provider]  atenolol (TENORMIN) 100 MG tablet Take 100 mg by mouth daily.    [provider]  glimepiride (AMARYL) 2 MG tablet Take 2 mg by mouth 3 (three) times a week.    [provider]  indapamide (LOZOL) 2.5 MG tablet Take 2.5 mg by mouth daily.    [provider]  indomethacin (INDOCIN) 25 MG  capsule Take 25 mg by mouth 3 (three) times daily as needed.    [provider]  metFORMIN (GLUCOPHAGE) 500 MG tablet Take 1,000 mg by mouth 2 (two) times daily with a meal.    [provider]  potassium chloride (K-DUR,KLOR-CON) 10 MEQ tablet Take 10 mEq by mouth daily.    [provider]  simvastatin (ZOCOR) 40 MG tablet Take 40 mg by mouth daily.    [provider]  valsartan (DIOVAN) 160 MG tablet Take 160 mg by mouth daily.    [provider]    Allergies Patient has no known allergies.  No family history on file.  Social History Social History   Tobacco Use  . Smoking status: Never Smoker  Substance Use Topics  . Alcohol use: No  . Drug use: Not on file    Review of Systems  Constitutional: No fever. Eyes: No visual changes. ENT: Positive for neck pain. Cardiovascular: Denies chest pain. Respiratory: Denies shortness of breath. Gastrointestinal: No vomiting.  Genitourinary: Negative for flank pain.  Musculoskeletal: Negative for back pain. Skin: Negative for abrasion or laceration. Neurological: Positive reciprocal headache.   ____________________________________________   PHYSICAL EXAM:  VITAL SIGNS: ED Triage Vitals  Enc Vitals Group     BP 11/10/17 1402 (!) 207/84     Pulse Rate 11/10/17 1402 64     Resp 11/10/17 1402 18  Temp 11/10/17 1402 (!) 97.5 F (36.4 C)     Temp Source 11/10/17 1402 Oral     SpO2 11/10/17 1402 97 %     Weight 11/10/17 1403 100 lb (45.4 kg)     Height 11/10/17 1403 4\' 11"  (1.499 m)     Head Circumference --      Peak Flow --      Pain Score 11/10/17 1403 0     Pain Loc --      Pain Edu? --      Excl. in GC? --     Constitutional: Alert and oriented. Well appearing and in no acute distress. Eyes: Conjunctivae are normal.  EOMI.  PERRLA. Head: Approximately 10 cm occipital hematoma. Nose: No congestion/rhinnorhea. Mouth/Throat: Mucous membranes are moist.   Neck: Normal  range of motion.  Mild midline cervical spinal tenderness.  No step-off or crepitus. Cardiovascular:  Good peripheral circulation. Respiratory: Normal respiratory effort.  Gastrointestinal:  No distention.  Genitourinary: No CVA tenderness. Musculoskeletal: No lower extremity edema.  Extremities warm and well perfused.  Neurologic:  Normal speech and language.  Motor and sensory intact in all extremities.  Normal coordination.  No gross focal neurologic deficits are appreciated.  Skin:  Skin is warm and dry. No rash noted. Psychiatric: Mood and affect are normal. Speech and behavior are normal.  ____________________________________________   LABS (all labs ordered are listed, but only abnormal results are displayed)  Labs Reviewed  BASIC METABOLIC PANEL - Abnormal; Notable for the following components:      Result Value   CO2 20 (*)    Glucose, Bld 196 (*)    BUN 26 (*)    Creatinine, Ser 1.23 (*)    GFR calc non Af Amer 37 (*)    GFR calc Af Amer 43 (*)    All other components within normal limits  CBC WITH DIFFERENTIAL/PLATELET - Abnormal; Notable for the following components:   WBC 11.3 (*)    RBC 3.29 (*)    Hemoglobin 11.2 (*)    HCT 33.4 (*)    MCV 101.4 (*)    Neutro Abs 9.5 (*)    All other components within normal limits  PROTIME-INR  TYPE AND SCREEN  TYPE AND SCREEN   ____________________________________________  EKG  ED ECG REPORT I, Dionne Bucy, the attending physician, personally viewed and interpreted this ECG.  Date: 11/10/2017 EKG Time: 1519 Rate: 70 Rhythm: normal sinus rhythm QRS Axis: normal Intervals: normal ST/T Wave abnormalities: normal Narrative Interpretation: no evidence of acute ischemia  ____________________________________________  RADIOLOGY  CT head: Small right frontal parietal acute to subacute subdural hematoma  associated with subarachnoid hemorrhage and probable contusion in  the inferior right temporal lobe. There  is associated subarachnoid  hemorrhage/contusion in the posterior left cerebellum with blood  products layering dependently in the CSF space of the craniocervical  junction. No substantial midline shift.  2. Nondisplaced left occipital skull fracture.  3. Degenerative changes in the cervical spine without fracture.  4. Loss of cervical lordosis. This can be related to patient  positioning, muscle spasm or soft tissue injury.  5. Posterior left parietooccipital scalp contusion.     ____________________________________________   PROCEDURES  Procedure(s) performed: No  Procedures  Critical Care performed: Yes  CRITICAL CARE Performed by: Dionne Bucy   Total critical care time: 50 minutes  Critical care time was exclusive of separately billable procedures and treating other patients.  Critical care was necessary to treat or prevent imminent  or life-threatening deterioration.  Critical care was time spent personally by me on the following activities: development of treatment plan with patient and/or surrogate as well as nursing, discussions with consultants, evaluation of patient's response to treatment, examination of patient, obtaining history from patient or surrogate, ordering and performing treatments and interventions, ordering and review of laboratory studies, ordering and review of radiographic studies, pulse oximetry and re-evaluation of patient's condition. ____________________________________________   INITIAL IMPRESSION / ASSESSMENT AND PLAN / ED COURSE  Pertinent labs & imaging results that were available during my care of the patient were reviewed by me and considered in my medical decision making (see chart for details).  84 year old female with PMH as noted above presents with head injury after a fall from a bent down posture this afternoon.  No LOC, and no vomiting.  On exam, patient is hypertensive but other vital signs are normal.  She is A&Ox3 with  GCS of 15 and normal neurologic exam.  Past medical records reviewed in Epic and are noncontributory.  Plan: Symptomatic treatment, seizure prophylaxis with Keppra, blood pressure control, monitoring, consultation with neurosurgery and transfer to tertiary center.    ----------------------------------------- 3:42 PM on 11/10/2017 -----------------------------------------  Patient's family member is a Loss adjuster, charteredtrauma registrar at [facility].  She put me on the phone with Dr. Janee Mornhompson from the trauma service who has kindly agreed to accept the patient.  We will contact the transfer center to arrange the transfer.  ----------------------------------------- 5:21 PM on 11/10/2017 -----------------------------------------  Patient remained stable in the ED.  The [facility], and was transported.  ____________________________________________   FINAL CLINICAL IMPRESSION(S) / ED DIAGNOSES  Final diagnoses:  Closed fracture of occipital bone, unspecified laterality, unspecified occipital fracture type, initial encounter (HCC)  Subdural hemorrhage (HCC)      NEW MEDICATIONS STARTED DURING THIS VISIT:  Discharge Medication List as of 11/10/2017  5:16 PM       Note:  This document was prepared using Dragon voice recognition software and may include unintentional dictation errors.    Dionne BucySiadecki, Iva Montelongo, MD 11/10/17 1721

## 2017-11-10 NOTE — ED Provider Notes (Signed)
[facility] EMERGENCY DEPARTMENT Provider Note   CSN: [id number] Arrival date & time: 11/10/17  1755     History   Chief Complaint Chief Complaint  Patient presents with  . Head Injury    HPI Amy Aguirre is a 84 y.o. female.  HPI 84 year old female transferred here from [place] with subdural hematoma.  Patient had a fall today.  She fell backwards and struck her head.  She was able to ambulate afterwards.  She presented to [facility] and had workup there done which showed a subdural hematoma on the right with some intracerebral bleeding.  She has been awake and alert and has some headache.  She denies being on any blood thinners.  She denies any other injury.  Patient was accepted in transfer by Dr. Violeta GelinasBurke Thompson. Past Medical History:  Diagnosis Date  . Diabetes mellitus without complication (HCC)   . HOH (hard of hearing)   . Hypertension   . PONV (postoperative nausea and vomiting)     There are no active problems to display for this patient.   Past Surgical History:  Procedure Laterality Date  . ABDOMINAL HYSTERECTOMY    . CATARACT EXTRACTION W/PHACO Right 04/18/2015   Procedure: CATARACT EXTRACTION PHACO AND INTRAOCULAR LENS PLACEMENT (IOC);  Surgeon: Galen ManilaWilliam Porfilio, MD;  Location: [facility] ORS;  Service: Ophthalmology;  Laterality: Right;  [place]   00:58.9 AP    24.1 CDE   14.19 casette lot#  . CHOLECYSTECTOMY    . GANGLION CYST EXCISION       OB History   None      Home Medications    Prior to Admission medications   Medication Sig Start Date End Date Taking? Authorizing Provider  aspirin 81 MG tablet Take 81 mg by mouth daily.    [provider]  atenolol (TENORMIN) 100 MG tablet Take 100 mg by mouth daily.    [provider]  glimepiride (AMARYL) 2 MG tablet Take 2 mg by mouth 3 (three) times a week.    [provider]  indapamide (LOZOL) 2.5 MG tablet Take 2.5 mg by mouth daily.    [provider]  indomethacin (INDOCIN) 25 MG capsule Take 25 mg by mouth 3 (three) times daily as needed.    [provider]  metFORMIN (GLUCOPHAGE) 500 MG tablet Take 1,000 mg by mouth 2 (two) times daily with a meal.    [provider]  potassium chloride (K-DUR,KLOR-CON) 10 MEQ tablet Take 10 mEq by mouth daily.    [provider]  simvastatin (ZOCOR) 40 MG tablet Take 40 mg by mouth daily.    [provider]  valsartan (DIOVAN) 160 MG tablet Take 160 mg by mouth daily.    [provider]    Family History No family history on file.  Social History Social History   Tobacco Use  . Smoking status: Never Smoker  Substance Use Topics  . Alcohol use: No  . Drug use: Not on file     Allergies   Patient has no known allergies.   Review of Systems Review of Systems  Neurological: Positive for dizziness and headaches.  All other systems reviewed and are negative.    Physical Exam Updated Vital Signs BP (!) 181/73 (BP Location: Right Arm)   Pulse 84   Temp 98.4 F (36.9 C) (Oral)   SpO2 98%   Physical Exam  Constitutional: She is oriented to person, place, and time. She appears well-developed and well-nourished.  No distress.  HENT:  Head: Normocephalic.  Right Ear: External ear normal.  Left Ear: External ear normal.  Eyes: Pupils are equal, round, and reactive to light. EOM are normal.  Neck: Normal range of motion. Neck supple.  Cardiovascular: Normal rate and regular rhythm.  No signs of trauma on chest wall no tenderness to palpation  Pulmonary/Chest: Effort normal.  Abdominal: Soft.  No signs of trauma on abdomen no tenderness palpation  Musculoskeletal:  Pelvis stable Bilateral lower extremities exam without any signs of trauma Bilateral extremities without signs of trauma  Neurological: She is alert and oriented to person, place, and time. She exhibits normal muscle tone.  Skin: Skin is warm. Capillary refill  takes less than 2 seconds.  Psychiatric: She has a normal mood and affect.  Nursing note and vitals reviewed.    ED Treatments / Results  Labs (all labs ordered are listed, but only abnormal results are displayed) Labs Reviewed - No data to display  EKG None  Radiology Ct Head Wo Contrast  Result Date: 11/10/2017 CLINICAL DATA:  Larey Seat and struck head. EXAM: CT HEAD WITHOUT CONTRAST CT CERVICAL SPINE WITHOUT CONTRAST TECHNIQUE: Multidetector CT imaging of the head and cervical spine was performed following the standard protocol without intravenous contrast. Multiplanar CT image reconstructions of the cervical spine were also generated. COMPARISON:  None. FINDINGS: CT HEAD FINDINGS Brain: A right frontoparietal acute to subacute subdural hematoma measures 3 mm in thickness and is associated with acute subarachnoid hemorrhage in the right temporal lobe. Low attenuation in the inferior right temporal lobe may be related to contusion. There is also low attenuation and subarachnoid hemorrhage in the posterior left cerebellum suggesting contusion. No substantial midline shift. No hydrocephalus. Atrophy appears age-appropriate. No focal or discrete intra-axial mass lesion evident. Vascular: Atherosclerotic calcification of the carotid siphons noted at the skull base. No dense MCA sign. Skull: Left occipital skull fracture extends from the foramen magnum up almost to the apex of the lambdoid suture. Sinuses/Orbits: The visualized paranasal sinuses and mastoid air cells are clear. Visualized portions of the globes and intraorbital fat are unremarkable. Other: Large left posterior parietooccipital scalp contusion evident. CT CERVICAL SPINE FINDINGS Alignment: Straightening of normal cervical lordosis. Skull base and vertebrae: No acute fracture. No primary bone lesion or focal pathologic process. Soft tissues and spinal canal: No prevertebral fluid or swelling. Acute hemorrhage is visible in the CSF space  dependently at the level of the foramen magnum, tracking down into the craniocervical junction. Disc levels: Loss of disc height noted at C5-6. Trace anterolisthesis of C4 on 5 and C6 on 7 is compatible with the facet degeneration at these levels. Upper chest: Unremarkable. Other: None. IMPRESSION: 1. Small right frontal parietal acute to subacute subdural hematoma associated with subarachnoid hemorrhage and probable contusion in the inferior right temporal lobe. There is associated subarachnoid hemorrhage/contusion in the posterior left cerebellum with blood products layering dependently in the CSF space of the craniocervical junction. No substantial midline shift. 2. Nondisplaced left occipital skull fracture. 3. Degenerative changes in the cervical spine without fracture. 4. Loss of cervical lordosis. This can be related to patient positioning, muscle spasm or soft tissue injury. 5. Posterior left parietooccipital scalp contusion. Critical Value/emergent results were called by telephone at the time of interpretation on 11/10/2017 at 3:19 pm to Dr. Lajean Manes, who verbally acknowledged these results. Electronically Signed   By: Kennith Center M.D.   On: 11/10/2017 15:21   Ct Cervical Spine Wo Contrast  Result Date:  11/10/2017 CLINICAL DATA:  Larey Seat and struck head. EXAM: CT HEAD WITHOUT CONTRAST CT CERVICAL SPINE WITHOUT CONTRAST TECHNIQUE: Multidetector CT imaging of the head and cervical spine was performed following the standard protocol without intravenous contrast. Multiplanar CT image reconstructions of the cervical spine were also generated. COMPARISON:  None. FINDINGS: CT HEAD FINDINGS Brain: A right frontoparietal acute to subacute subdural hematoma measures 3 mm in thickness and is associated with acute subarachnoid hemorrhage in the right temporal lobe. Low attenuation in the inferior right temporal lobe may be related to contusion. There is also low attenuation and subarachnoid hemorrhage in the  posterior left cerebellum suggesting contusion. No substantial midline shift. No hydrocephalus. Atrophy appears age-appropriate. No focal or discrete intra-axial mass lesion evident. Vascular: Atherosclerotic calcification of the carotid siphons noted at the skull base. No dense MCA sign. Skull: Left occipital skull fracture extends from the foramen magnum up almost to the apex of the lambdoid suture. Sinuses/Orbits: The visualized paranasal sinuses and mastoid air cells are clear. Visualized portions of the globes and intraorbital fat are unremarkable. Other: Large left posterior parietooccipital scalp contusion evident. CT CERVICAL SPINE FINDINGS Alignment: Straightening of normal cervical lordosis. Skull base and vertebrae: No acute fracture. No primary bone lesion or focal pathologic process. Soft tissues and spinal canal: No prevertebral fluid or swelling. Acute hemorrhage is visible in the CSF space dependently at the level of the foramen magnum, tracking down into the craniocervical junction. Disc levels: Loss of disc height noted at C5-6. Trace anterolisthesis of C4 on 5 and C6 on 7 is compatible with the facet degeneration at these levels. Upper chest: Unremarkable. Other: None. IMPRESSION: 1. Small right frontal parietal acute to subacute subdural hematoma associated with subarachnoid hemorrhage and probable contusion in the inferior right temporal lobe. There is associated subarachnoid hemorrhage/contusion in the posterior left cerebellum with blood products layering dependently in the CSF space of the craniocervical junction. No substantial midline shift. 2. Nondisplaced left occipital skull fracture. 3. Degenerative changes in the cervical spine without fracture. 4. Loss of cervical lordosis. This can be related to patient positioning, muscle spasm or soft tissue injury. 5. Posterior left parietooccipital scalp contusion. Critical Value/emergent results were called by telephone at the time of  interpretation on 11/10/2017 at 3:19 pm to Dr. Lajean Manes, who verbally acknowledged these results. Electronically Signed   By: Kennith Center M.D.   On: 11/10/2017 15:21    Procedures Procedures (including critical care time)  Medications Ordered in ED Medications - No data to display   Initial Impression / Assessment and Plan / ED Course  I have reviewed the triage vital signs and the nursing notes.  Pertinent labs & imaging results that were available during my care of the patient were reviewed by me and considered in my medical decision making (see chart for details).    Discussed with Dr. Donell Beers and she will see for admission  Final Clinical Impressions(s) / ED Diagnoses   Final diagnoses:  SDH (subdural hematoma) (HCC)  Fall, subsequent encounter    ED Discharge Orders    None       Margarita Grizzle, MD 11/10/17 [phone]

## 2017-11-10 NOTE — ED Notes (Signed)
Paged trauma per md

## 2017-11-10 NOTE — Consult Note (Signed)
Reason for Consult: Skull fracture, cortical contusion Referring Physician: Trauma  Amy Aguirre is an 84 y.o. female.  HPI: 84 year old female status post unwitnessed mechanical fall.  Possible very brief loss of consciousness.  Patient reports some posterior head pain.  No symptoms of numbness, paresthesias or weakness.  No history of seizure.  No history of other injury.  Evaluation in outside emergency room demonstrated evidence of a nondepressed left occipital skull fracture.  She has some scattered posttraumatic subarachnoid hemorrhage with some minimal right temporal contusion.  Past Medical History:  Diagnosis Date  . Diabetes mellitus without complication ([place])   . HOH (hard of hearing)   . Hypertension   . PONV (postoperative nausea and vomiting)     Past Surgical History:  Procedure Laterality Date  . ABDOMINAL HYSTERECTOMY    . CATARACT EXTRACTION W/PHACO Right 04/18/2015   Procedure: CATARACT EXTRACTION PHACO AND INTRAOCULAR LENS PLACEMENT (IOC);  Surgeon: Birder Robson, MD;  Location: [facility] ORS;  Service: Ophthalmology;  Laterality: Right;  [place]   00:58.9 AP    24.1 CDE   14.19 casette lot#  . CHOLECYSTECTOMY    . GANGLION CYST EXCISION      No family history on file.  Social History:  reports that she has never smoked. She does not have any smokeless tobacco history on file. She reports that she does not drink alcohol. Her drug history is not on file.  Allergies: No Known Allergies  Medications: I have reviewed the patient's current medications.  Results for orders placed or performed during the hospital encounter of 11/10/17 (from the past 48 hour(s))  Basic metabolic panel     Status: Abnormal   Collection Time: 11/10/17  3:17 PM  Result Value Ref Range   Sodium 137 135 - 145 mmol/L   Potassium 3.7 3.5 - 5.1 mmol/L   Chloride 105 101 - 111 mmol/L   CO2 20 (L) 22 - 32 mmol/L   Glucose, Bld 196 (H) 65 - 99 mg/dL   BUN 26 (H) 6 - 20 mg/dL   Creatinine, Ser  1.23 (H) 0.44 - 1.00 mg/dL   Calcium 9.6 8.9 - 10.3 mg/dL   GFR calc non Af Amer 37 (L) >60 mL/min   GFR calc Af Amer 43 (L) >60 mL/min    Comment: (NOTE) The eGFR has been calculated using the CKD EPI equation. This calculation has not been validated in all clinical situations. eGFR's persistently <60 mL/min signify possible Chronic Kidney Disease.    Anion gap 12 5 - 15    Comment: Performed at [facility], [address] [place], [place] [address]  CBC with Differential     Status: Abnormal   Collection Time: 11/10/17  3:17 PM  Result Value Ref Range   WBC 11.3 (H) 3.6 - 11.0 K/uL   RBC 3.29 (L) 3.80 - 5.20 MIL/uL   Hemoglobin 11.2 (L) 12.0 - 16.0 g/dL   HCT 33.4 (L) 35.0 - 47.0 %   MCV 101.4 (H) 80.0 - 100.0 fL   MCH 34.0 26.0 - 34.0 pg   MCHC 33.5 32.0 - 36.0 g/dL   RDW 14.0 11.5 - 14.5 %   Platelets 213 150 - 440 K/uL   Neutrophils Relative % 84 %   Neutro Abs 9.5 (H) 1.4 - 6.5 K/uL   Lymphocytes Relative 12 %   Lymphs Abs 1.3 1.0 - 3.6 K/uL   Monocytes Relative 4 %   Monocytes Absolute 0.5 0.2 - 0.9 K/uL   Eosinophils Relative  0 %   Eosinophils Absolute 0.0 0 - 0.7 K/uL   Basophils Relative 0 %   Basophils Absolute 0.0 0 - 0.1 K/uL    Comment: Performed at [facility], [address] [place], [place] [address]  Protime-INR     Status: None   Collection Time: 11/10/17  3:17 PM  Result Value Ref Range   Prothrombin Time 14.0 11.4 - 15.2 seconds   INR 1.09     Comment: Performed at [facility], [address] [place], [place] [address]  Type and screen Ordered by PROVIDER DEFAULT     Status: None   Collection Time: 11/10/17  4:08 PM  Result Value Ref Range   ABO/RH(D) B POS    Antibody Screen NEG    Sample Expiration      11/13/2017 Performed at [facility], [address] [place], [place] [address]     Ct Head Wo Contrast  Result Date: 11/10/2017 CLINICAL DATA:  [place] and struck head. EXAM: CT HEAD WITHOUT  CONTRAST CT CERVICAL SPINE WITHOUT CONTRAST TECHNIQUE: Multidetector CT imaging of the head and cervical spine was performed following the standard protocol without intravenous contrast. Multiplanar CT image reconstructions of the cervical spine were also generated. COMPARISON:  None. FINDINGS: CT HEAD FINDINGS Brain: A right frontoparietal acute to subacute subdural hematoma measures 3 mm in thickness and is associated with acute subarachnoid hemorrhage in the right temporal lobe. Low attenuation in the inferior right temporal lobe may be related to contusion. There is also low attenuation and subarachnoid hemorrhage in the posterior left cerebellum suggesting contusion. No substantial midline shift. No hydrocephalus. Atrophy appears age-appropriate. No focal or discrete intra-axial mass lesion evident. Vascular: Atherosclerotic calcification of the carotid siphons noted at the skull base. No dense MCA sign. Skull: Left occipital skull fracture extends from the foramen magnum up almost to the apex of the lambdoid suture. Sinuses/Orbits: The visualized paranasal sinuses and mastoid air cells are clear. Visualized portions of the globes and intraorbital fat are unremarkable. Other: Large left posterior parietooccipital scalp contusion evident. CT CERVICAL SPINE FINDINGS Alignment: Straightening of normal cervical lordosis. Skull base and vertebrae: No acute fracture. No primary bone lesion or focal pathologic process. Soft tissues and spinal canal: No prevertebral fluid or swelling. Acute hemorrhage is visible in the CSF space dependently at the level of the foramen magnum, tracking down into the craniocervical junction. Disc levels: Loss of disc height noted at C5-6. Trace anterolisthesis of C4 on 5 and C6 on 7 is compatible with the facet degeneration at these levels. Upper chest: Unremarkable. Other: None. IMPRESSION: 1. Small right frontal parietal acute to subacute subdural hematoma associated with subarachnoid  hemorrhage and probable contusion in the inferior right temporal lobe. There is associated subarachnoid hemorrhage/contusion in the posterior left cerebellum with blood products layering dependently in the CSF space of the craniocervical junction. No substantial midline shift. 2. Nondisplaced left occipital skull fracture. 3. Degenerative changes in the cervical spine without fracture. 4. Loss of cervical lordosis. This can be related to patient positioning, muscle spasm or soft tissue injury. 5. Posterior left parietooccipital scalp contusion. Critical Value/emergent results were called by telephone at the time of interpretation on 11/10/2017 at 3:19 pm to Dr. Toney Rakes, who verbally acknowledged these results. Electronically Signed   By: Misty Stanley M.D.   On: 11/10/2017 15:21   Ct Cervical Spine Wo Contrast  Result Date: 11/10/2017 CLINICAL DATA:  [place] and struck head. EXAM: CT HEAD WITHOUT CONTRAST CT  CERVICAL SPINE WITHOUT CONTRAST TECHNIQUE: Multidetector CT imaging of the head and cervical spine was performed following the standard protocol without intravenous contrast. Multiplanar CT image reconstructions of the cervical spine were also generated. COMPARISON:  None. FINDINGS: CT HEAD FINDINGS Brain: A right frontoparietal acute to subacute subdural hematoma measures 3 mm in thickness and is associated with acute subarachnoid hemorrhage in the right temporal lobe. Low attenuation in the inferior right temporal lobe may be related to contusion. There is also low attenuation and subarachnoid hemorrhage in the posterior left cerebellum suggesting contusion. No substantial midline shift. No hydrocephalus. Atrophy appears age-appropriate. No focal or discrete intra-axial mass lesion evident. Vascular: Atherosclerotic calcification of the carotid siphons noted at the skull base. No dense MCA sign. Skull: Left occipital skull fracture extends from the foramen magnum up almost to the apex of the lambdoid suture.  Sinuses/Orbits: The visualized paranasal sinuses and mastoid air cells are clear. Visualized portions of the globes and intraorbital fat are unremarkable. Other: Large left posterior parietooccipital scalp contusion evident. CT CERVICAL SPINE FINDINGS Alignment: Straightening of normal cervical lordosis. Skull base and vertebrae: No acute fracture. No primary bone lesion or focal pathologic process. Soft tissues and spinal canal: No prevertebral fluid or swelling. Acute hemorrhage is visible in the CSF space dependently at the level of the foramen magnum, tracking down into the craniocervical junction. Disc levels: Loss of disc height noted at C5-6. Trace anterolisthesis of C4 on 5 and C6 on 7 is compatible with the facet degeneration at these levels. Upper chest: Unremarkable. Other: None. IMPRESSION: 1. Small right frontal parietal acute to subacute subdural hematoma associated with subarachnoid hemorrhage and probable contusion in the inferior right temporal lobe. There is associated subarachnoid hemorrhage/contusion in the posterior left cerebellum with blood products layering dependently in the CSF space of the craniocervical junction. No substantial midline shift. 2. Nondisplaced left occipital skull fracture. 3. Degenerative changes in the cervical spine without fracture. 4. Loss of cervical lordosis. This can be related to patient positioning, muscle spasm or soft tissue injury. 5. Posterior left parietooccipital scalp contusion. Critical Value/emergent results were called by telephone at the time of interpretation on 11/10/2017 at 3:19 pm to Dr. Toney Rakes, who verbally acknowledged these results. Electronically Signed   By: Misty Stanley M.D.   On: 11/10/2017 15:21    Pertinent items noted in HPI and remainder of comprehensive ROS otherwise negative. Blood pressure (!) 183/77, pulse 80, temperature 98.4 F (36.9 C), temperature source Oral, SpO2 100 %. Patient is awake and alert.  She is oriented and  appropriate.  Her speech is fluent.  Her judgment and insight are intact.  Cranial nerve function normal bilaterally.  Motor examination 5/5 bilaterally.  No pronator drift.  Sensory examination nonfocal.  Examination of her head demonstrates evidence of some left occipital swelling with a small closed laceration.  Oropharynx, nasopharynx and external auditory canals clear.  Chest and abdomen benign.  Cervical spine nontender with a full active range of motion.  Extremities free from injury deformity.  Assessment/Plan: Status post fall with mild traumatic brain injury.  Patient with occipital skull fracture which will require no treatment.  She has some posttraumatic subarachnoid hemorrhage.  Patient should be admitted for observation and have a follow-up head CT scan tomorrow.  If head CT scan stable then patient may be discharged home with family.  No follow-up with me necessary.  Mallie Mussel A Kayleann Mccaffery 11/10/2017, 7:51 PM

## 2017-11-10 NOTE — ED Notes (Signed)
ED Provider at bedside. Ray at bedside

## 2017-11-10 NOTE — ED Notes (Signed)
Pt stable at time of transfer, neuro WDL pt A&OX4, pt verbalizes transfer consent and signed. PT and family denies any further concerns at this time. VSS for trasnfer.

## 2017-11-11 ENCOUNTER — Observation Stay (HOSPITAL_COMMUNITY): Payer: Medicare Other

## 2017-11-11 LAB — CBC
HEMATOCRIT: 30.2 % — AB (ref 36.0–46.0)
Hemoglobin: 10.4 g/dL — ABNORMAL LOW (ref 12.0–15.0)
MCH: 33.5 pg (ref 26.0–34.0)
MCHC: 34.4 g/dL (ref 30.0–36.0)
MCV: 97.4 fL (ref 78.0–100.0)
Platelets: 197 10*3/uL (ref 150–400)
RBC: 3.1 MIL/uL — AB (ref 3.87–5.11)
RDW: 13.8 % (ref 11.5–15.5)
WBC: 10 10*3/uL (ref 4.0–10.5)

## 2017-11-11 LAB — GLUCOSE, CAPILLARY
GLUCOSE-CAPILLARY: 167 mg/dL — AB (ref 65–99)
GLUCOSE-CAPILLARY: 109 mg/dL — AB (ref 65–99)
Glucose-Capillary: 119 mg/dL — ABNORMAL HIGH (ref 65–99)

## 2017-11-11 LAB — BASIC METABOLIC PANEL
ANION GAP: 14 (ref 5–15)
BUN: 22 mg/dL — ABNORMAL HIGH (ref 6–20)
CHLORIDE: 105 mmol/L (ref 101–111)
CO2: 20 mmol/L — AB (ref 22–32)
Calcium: 9.5 mg/dL (ref 8.9–10.3)
Creatinine, Ser: 1.21 mg/dL — ABNORMAL HIGH (ref 0.44–1.00)
GFR calc non Af Amer: 38 mL/min — ABNORMAL LOW (ref >60)
GFR, EST AFRICAN AMERICAN: 44 mL/min — AB (ref >60)
Glucose, Bld: 186 mg/dL — ABNORMAL HIGH (ref 65–99)
POTASSIUM: 4.4 mmol/L (ref 3.5–5.1)
SODIUM: 139 mmol/L (ref 135–145)

## 2017-11-11 LAB — MRSA PCR SCREENING: MRSA BY PCR: NEGATIVE

## 2017-11-11 MED FILL — Labetalol HCl IV Soln Prefilled Syringe 20 MG/4ML (5 MG/ML): INTRAVENOUS | Qty: 4 | Status: AC

## 2017-11-11 NOTE — Care Management Note (Signed)
Case Management Note  Patient Details  Name: Amy NovakDorene Aguirre MRN: [id number] Date of Birth: 04/27/1925  Subjective/Objective:   Pt admitted on 11/10/17 s/p unwitnessed mechanical fall backward striking the back of her head and elbows.  CT shows R SAH on the right and Left parieto-occipital region, enlarging right SDH and contusion of the left cerebellum.  PTA, pt independent, lives at home alone.                Action/Plan: PT/OT recommending OP follow up.  Family able to provide intermittent supervision at discharge.  Will sent referrals to OP rehab center for OP therapy follow up.    Expected Discharge Date:                  Expected Discharge Plan:  OP Rehab  In-House Referral:  Clinical Social Work  Discharge planning Services  CM Consult  Post Acute Care Choice:    Choice offered to:     DME Arranged:    DME Agency:     HH Arranged:    HH Agency:     Status of Service:  In process, will continue to follow  If discussed at Long Length of Stay Meetings, dates discussed:    Additional Comments:  Quintella BatonJulie W. Manu Rubey, RN, BSN  Trauma/Neuro ICU Case Manager [phone]

## 2017-11-11 NOTE — Progress Notes (Signed)
Subjective: Mild HA, dizzy when she tried to get up, has not been down to CT yet  Objective: Vital signs in last 24 hours: Temp:  [97.5 F (36.4 C)-98.4 F (36.9 C)] 98.3 F (36.8 C) (04/16 0400) Pulse Rate:  [64-84] 67 (04/16 0600) Resp:  [15-25] 21 (04/16 0600) BP: (132-207)/(60-102) 155/63 (04/16 0600) SpO2:  [95 %-100 %] 95 % (04/16 0600) Weight:  [43 kg (94 lb 12.8 oz)-45.4 kg (100 lb)] 43 kg (94 lb 12.8 oz) (04/15 2300)    Intake/Output from previous day: No intake/output data recorded. Intake/Output this shift: No intake/output data recorded.  General appearance: cooperative Resp: clear to auscultation bilaterally Chest wall: no tenderness Cardio: regular rate and rhythm GI: soft, NT, ND  Neuro: awake, PERL, F/C and MAE  Lab Results: CBC  Recent Labs    11/10/17 1517 11/11/17 0259  WBC 11.3* 10.0  HGB 11.2* 10.4*  HCT 33.4* 30.2*  PLT 213 197   BMET Recent Labs    11/10/17 1517 11/11/17 0259  NA 137 139  K 3.7 4.4  CL 105 105  CO2 20* 20*  GLUCOSE 196* 186*  BUN 26* 22*  CREATININE 1.23* 1.21*  CALCIUM 9.6 9.5   PT/INR Recent Labs    11/10/17 1517  LABPROT 14.0  INR 1.09   ABG No results for input(s): PHART, HCO3 in the last 72 hours.  Invalid input(s): PCO2, PO2  Studies/Results: Ct Head Wo Contrast  Result Date: 11/10/2017 CLINICAL DATA:  [place]arey SeatFell and struck head. EXAM: CT HEAD WITHOUT CONTRAST CT CERVICAL SPINE WITHOUT CONTRAST TECHNIQUE: Multidetector CT imaging of the head and cervical spine was performed following the standard protocol without intravenous contrast. Multiplanar CT image reconstructions of the cervical spine were also generated. COMPARISON:  None. FINDINGS: CT HEAD FINDINGS Brain: A right frontoparietal acute to subacute subdural hematoma measures 3 mm in thickness and is associated with acute subarachnoid hemorrhage in the right temporal lobe. Low attenuation in the inferior right temporal lobe may be related to  contusion. There is also low attenuation and subarachnoid hemorrhage in the posterior left cerebellum suggesting contusion. No substantial midline shift. No hydrocephalus. Atrophy appears age-appropriate. No focal or discrete intra-axial mass lesion evident. Vascular: Atherosclerotic calcification of the carotid siphons noted at the skull base. No dense MCA sign. Skull: Left occipital skull fracture extends from the foramen magnum up almost to the apex of the lambdoid suture. Sinuses/Orbits: The visualized paranasal sinuses and mastoid air cells are clear. Visualized portions of the globes and intraorbital fat are unremarkable. Other: Large left posterior parietooccipital scalp contusion evident. CT CERVICAL SPINE FINDINGS Alignment: Straightening of normal cervical lordosis. Skull base and vertebrae: No acute fracture. No primary bone lesion or focal pathologic process. Soft tissues and spinal canal: No prevertebral fluid or swelling. Acute hemorrhage is visible in the CSF space dependently at the level of the foramen magnum, tracking down into the craniocervical junction. Disc levels: Loss of disc height noted at C5-6. Trace anterolisthesis of C4 on 5 and C6 on 7 is compatible with the facet degeneration at these levels. Upper chest: Unremarkable. Other: None. IMPRESSION: 1. Small right frontal parietal acute to subacute subdural hematoma associated with subarachnoid hemorrhage and probable contusion in the inferior right temporal lobe. There is associated subarachnoid hemorrhage/contusion in the posterior left cerebellum with blood products layering dependently in the CSF space of the craniocervical junction. No substantial midline shift. 2. Nondisplaced left occipital skull fracture. 3. Degenerative changes in the cervical spine without fracture. 4. Loss  of cervical lordosis. This can be related to patient positioning, muscle spasm or soft tissue injury. 5. Posterior left parietooccipital scalp contusion.  Critical Value/emergent results were called by telephone at the time of interpretation on 11/10/2017 at 3:19 pm to Dr. Lajean Manes, who verbally acknowledged these results. Electronically Signed   By: Kennith Center M.D.   On: 11/10/2017 15:21   Ct Cervical Spine Wo Contrast  Result Date: 11/10/2017 CLINICAL DATA:  [place] and struck head. EXAM: CT HEAD WITHOUT CONTRAST CT CERVICAL SPINE WITHOUT CONTRAST TECHNIQUE: Multidetector CT imaging of the head and cervical spine was performed following the standard protocol without intravenous contrast. Multiplanar CT image reconstructions of the cervical spine were also generated. COMPARISON:  None. FINDINGS: CT HEAD FINDINGS Brain: A right frontoparietal acute to subacute subdural hematoma measures 3 mm in thickness and is associated with acute subarachnoid hemorrhage in the right temporal lobe. Low attenuation in the inferior right temporal lobe may be related to contusion. There is also low attenuation and subarachnoid hemorrhage in the posterior left cerebellum suggesting contusion. No substantial midline shift. No hydrocephalus. Atrophy appears age-appropriate. No focal or discrete intra-axial mass lesion evident. Vascular: Atherosclerotic calcification of the carotid siphons noted at the skull base. No dense MCA sign. Skull: Left occipital skull fracture extends from the foramen magnum up almost to the apex of the lambdoid suture. Sinuses/Orbits: The visualized paranasal sinuses and mastoid air cells are clear. Visualized portions of the globes and intraorbital fat are unremarkable. Other: Large left posterior parietooccipital scalp contusion evident. CT CERVICAL SPINE FINDINGS Alignment: Straightening of normal cervical lordosis. Skull base and vertebrae: No acute fracture. No primary bone lesion or focal pathologic process. Soft tissues and spinal canal: No prevertebral fluid or swelling. Acute hemorrhage is visible in the CSF space dependently at the level of the  foramen magnum, tracking down into the craniocervical junction. Disc levels: Loss of disc height noted at C5-6. Trace anterolisthesis of C4 on 5 and C6 on 7 is compatible with the facet degeneration at these levels. Upper chest: Unremarkable. Other: None. IMPRESSION: 1. Small right frontal parietal acute to subacute subdural hematoma associated with subarachnoid hemorrhage and probable contusion in the inferior right temporal lobe. There is associated subarachnoid hemorrhage/contusion in the posterior left cerebellum with blood products layering dependently in the CSF space of the craniocervical junction. No substantial midline shift. 2. Nondisplaced left occipital skull fracture. 3. Degenerative changes in the cervical spine without fracture. 4. Loss of cervical lordosis. This can be related to patient positioning, muscle spasm or soft tissue injury. 5. Posterior left parietooccipital scalp contusion. Critical Value/emergent results were called by telephone at the time of interpretation on 11/10/2017 at 3:19 pm to Dr. Lajean Manes, who verbally acknowledged these results. Electronically Signed   By: Kennith Center M.D.   On: 11/10/2017 15:21    Anti-infectives: Anti-infectives (From admission, onward)   None      Assessment/Plan: Fall TBI/occipital skull FX/R FP SDH/R temporal ICC/L cerebellar ICC - exam stable, F/U CT head pending now, Dr. Jordan Likes following, TBI team therapies DM - SSI HTN - home meds FEN - carb mod diet Dispo - therapies, if CT H stable will TF to 4NP I spoke with her daughter    LOS: 0 days    [name], MD, MPH, FACS Trauma: [phone] General Surgery: [phone]  4/16/2019Patient ID: Amy Aguirre, female   DOB: 11-Jun-1925, 84 y.o.   MRN: [id number]

## 2017-11-11 NOTE — Progress Notes (Signed)
OT Evaluation  Pt admitted with the diagnosis below and has the deficits listed. Pt would benefit from cont OT to increase independence and safety with basic adl tranfers and gain More independence with adls when on her feet. Pt has a very supportive family that can stay with her 24/7 and take her to OP therapy. Pt could not tolerate much mobility Today, so need to evaluate further how steady she is on her feet when doing adls. Rec OPOT.  Will continue to follow.   11/11/17 1200  OT Visit Information  Last OT Received On 11/11/17  Assistance Needed +1  History of Present Illness pt is a 84 y/o female with pmh significant for DM, HTN admitted after unwitnessed mechanical fall backward striking the back of her head and elbows.  CT shows R SAH on the right and Left parieto-occipital region, enlarging right SDH and contusion of the left cerebellum.  Precautions  Precautions Fall  Restrictions  Weight Bearing Restrictions No  Home Living  Family/patient expects to be discharged to: Private residence  Living Arrangements Alone  Available Help at Discharge Family;Available PRN/intermittently  Type of Home House  Home Access Stairs to enter  Entrance Stairs-Number of Steps 2  Entrance Stairs-Rails Right  Home Layout One level  Bathroom Shower/Tub Tub/shower unit  Tour managerBathroom Toilet Standard  Home Equipment None  Additional Comments Pt very independent. Drives, cooks, cleans, Psychologist, occupationalmows lawn etc.  Prior Function  Level of Independence Independent  Comments mows her yard, drives, runs her own errands.  Communication  Communication No difficulties  Pain Assessment  Pain Assessment Faces  Faces Pain Scale 4  Pain Location headache  Pain Descriptors / Indicators Aching  Pain Intervention(s) Monitored during session;Repositioned  Cognition  Arousal/Alertness Lethargic  Behavior During Therapy WFL for tasks assessed/performed  Overall Cognitive Status Within Functional Limits for tasks assessed   General Comments Pt very sleepy but easily arousable and followed all commands.  Upper Extremity Assessment  Upper Extremity Assessment Overall WFL for tasks assessed  Lower Extremity Assessment  Lower Extremity Assessment Defer to PT evaluation  Cervical / Trunk Assessment  Cervical / Trunk Assessment Normal  ADL  Overall ADL's  Needs assistance/impaired  Eating/Feeding Set up;Sitting  Eating/Feeding Details (indicate cue type and reason) cues to stay awake. pt not a big eater even before fall  Grooming Brushing hair;Oral care;Set up;Sitting  Grooming Details (indicate cue type and reason) Pt groomed at chair as not feeling like walking to bathroom because of dizziness.  Upper Body Bathing Set up;Sitting  Lower Body Bathing Minimal assistance;Sit to/from stand  Lower Body Bathing Details (indicate cue type and reason) balance deficits in standing.   Upper Body Dressing  Set up;Sitting  Lower Body Dressing Minimal assistance;Sit to/from stand  Lower Body Dressing Details (indicate cue type and reason) balance deficits while standing  Toilet Transfer Minimal assistance;BSC;Stand-pivot  Toilet Transfer Details (indicate cue type and reason) pt too dizzy to walk to bathroom.  [facility] to [facility].  Toileting- Clothing Manipulation and Hygiene Moderate assistance;Sit to/from stand  Toileting - Clothing Manipulation Details (indicate cue type and reason) Pt required a great amount of assist to maintain standing while pulling pants up and cleaning self.  Pt felt dizzy when standing.  Functional mobility during ADLs Minimal assistance  General ADL Comments When standing, pt requires assist due to dizziness.  Vision- History  Baseline Vision/History Wears glasses;Cataracts  Wears Glasses At all times  Patient Visual Report Blurring of vision  Vision- Assessment  Vision Assessment?  No apparent visual deficits  Additional Comments needs to be assessed further. Pt unalbe to keep eyes open for long  periods of time making testing difficult.  Perception  Perception Tested? Yes  Comments no deficits  Praxis  Praxis tested? WFL  Bed Mobility  Overal bed mobility Needs Assistance  Bed Mobility Supine to Sit  Supine to sit Supervision  General bed mobility comments pt moving tentatively, using rails and keeping her UE's in helpful positions for safety  Transfers  Overall transfer level Needs assistance  Equipment used 1 person hand held assist  Transfers Sit to/from Stand;Stand Pivot Transfers  Sit to Stand Min assist  Stand pivot transfers Mod assist  General transfer comment pt using hands and the back of her legs for stability.  Assist also for stability  Balance  Overall balance assessment Needs assistance  Sitting-balance support Feet supported  Sitting balance-Leahy Scale Fair  Sitting balance - Comments pt preferring to use UE's for balance, but able to sit statically without use of her arms  Standing balance support Bilateral upper extremity supported;During functional activity  Standing balance-Leahy Scale Poor  Standing balance comment needed external support  General Comments  General comments (skin integrity, edema, etc.) Pt more limited in standing than sitting.  Pt difficult to keep awake.    OT - End of Session  Equipment Utilized During Treatment Gait belt  Activity Tolerance Patient limited by lethargy;Patient limited by fatigue  Patient left in chair;with call bell/phone within reach  Nurse Communication Mobility status  OT Assessment  OT Recommendation/Assessment Patient needs continued OT Services  OT Visit Diagnosis Unsteadiness on feet (R26.81);History of falling (Z91.81);Other abnormalities of gait and mobility (R26.89);Dizziness and giddiness (R42);Pain;Other symptoms and signs involving the nervous system (R29.898)  Pain - part of body  (head)  OT Problem List Decreased activity tolerance;Impaired balance (sitting and/or standing);Impaired  vision/perception;Pain  Barriers to Discharge Comments lives alone but has great family support  OT Plan  OT Frequency (ACUTE ONLY) Min 2X/week  OT Treatment/Interventions (ACUTE ONLY) Self-care/ADL training;Therapeutic activities;Balance training  AM-PAC OT "6 Clicks" Daily Activity Outcome Measure  Help from another person eating meals? 3  Help from another person taking care of personal grooming? 3  Help from another person toileting, which includes using toliet, bedpan, or urinal? 3  Help from another person bathing (including washing, rinsing, drying)? 3  Help from another person to put on and taking off regular upper body clothing? 3  Help from another person to put on and taking off regular lower body clothing? 2  6 Click Score 17  ADL G Code Conversion CK  OT Recommendation  Follow Up Recommendations Outpatient OT;Supervision/Assistance - 24 hour  OT Equipment None recommended by OT;Tub/shower bench  Individuals Consulted  Consulted and Agree with Results and Recommendations Family member/caregiver  Family Member Consulted son and daughter  Acute Rehab OT Goals  Patient Stated Goal get home  OT Goal Formulation With patient/family  Time For Goal Achievement 11/25/17  Potential to Achieve Goals Good  OT Time Calculation  OT Start Time (ACUTE ONLY) 1126  OT Stop Time (ACUTE ONLY) 1202  OT Time Calculation (min) 36 min  OT General Charges  $OT Visit 1 Visit  OT Evaluation  $OT Eval Moderate Complexity 1 Mod  OT Treatments  $Self Care/Home Management  8-22 mins  Written Expression  Dominant Hand Right   Tory Emerald, OTR/L

## 2017-11-11 NOTE — Progress Notes (Signed)
Follow-up brain CT looks good.  Okay from my standpoint for discharge home when patient stable on her feet.  Follow-up with me as needed.

## 2017-11-11 NOTE — Progress Notes (Addendum)
Physical Therapy Evaluation Patient Details Name: Amy NovakDorene Aguirre MRN: [id number] DOB: 08/28/1924 Today's Date: 11/11/2017   History of Present Illness  pt is a 84 y/o female with pmh significant for DM, HTN admitted after unwitnessed mechanical fall backward striking the back of her head and elbows.  CT shows R SAH on the right and Left parieto-occipital region, enlarging right SDH and contusion of the left cerebellum.  Clinical Impression  Pt admitted with/for fall with TBI/SAH/SDH and possible BPPV.  Will continue vestibular testing tomorrow..  Pt currently limited functionally due to the problems listed below.  (see problems list.)  Pt will benefit from PT to maximize function and safety to be able to get home safely with available assist.     Follow Up Recommendations Outpatient PT(may need vestibular follow up if pt is found to have BPPV)    Equipment Recommendations  None recommended by PT;Other (comment)(TBA)    Recommendations for Other Services       Precautions / Restrictions Precautions Precautions: Fall      Mobility  Bed Mobility Overal bed mobility: Needs Assistance Bed Mobility: Rolling;Sidelying to Sit;Sit to Supine Rolling: Min guard Sidelying to sit: Min guard   Sit to supine: Min guard   General bed mobility comments: pt moving tentatively, using rails and keeping her UE's in helpful positions for safety  Transfers Overall transfer level: Needs assistance Equipment used: None Transfers: Sit to/from Stand Sit to Stand: Min assist         General transfer comment: pt using hands and the back of her legs for stability.  Assist also for stability  Ambulation/Gait             General Gait Details: deferred due to escalating dizziness.  Stairs            Wheelchair Mobility    Modified Rankin (Stroke Patients Only)       Balance Overall balance assessment: Needs assistance   Sitting balance-Leahy Scale: Fair Sitting balance -  Comments: pt preferring to use UE's for balance, but able to sit statically without use of her arms   Standing balance support: Single extremity supported Standing balance-Leahy Scale: Poor Standing balance comment: needed external support                             Pertinent Vitals/Pain Pain Assessment: Faces Faces Pain Scale: Hurts little more Pain Location: headache Pain Descriptors / Indicators: Aching Pain Intervention(s): Monitored during session    Home Living Family/patient expects to be discharged to:: Private residence Living Arrangements: Alone Available Help at Discharge: Family;Available PRN/intermittently(Family members lives just a few houses down.) Type of Home: House Home Access: Stairs to enter Entrance Stairs-Rails: Right Entrance Stairs-Number of Steps: 2 Home Layout: One level Home Equipment: None      Prior Function Level of Independence: Independent         Comments: mows her yard, drives, runs her own errands.     Hand Dominance        Extremity/Trunk Assessment   Upper Extremity Assessment Upper Extremity Assessment: Overall WFL for tasks assessed    Lower Extremity Assessment Lower Extremity Assessment: Overall WFL for tasks assessed       Communication   Communication: No difficulties  Cognition Arousal/Alertness: Awake/alert Behavior During Therapy: WFL for tasks assessed/performed Overall Cognitive Status: Within Functional Limits for tasks assessed  General Comments General comments (skin integrity, edema, etc.): Noted nystagmus with positional changes, but it was difficult to assess with limited mobility.    Exercises     Assessment/Plan    PT Assessment Patient needs continued PT services  PT Problem List Decreased activity tolerance;Decreased balance;Decreased mobility;Pain;Decreased knowledge of use of DME       PT Treatment Interventions Gait  training;Functional mobility training;Therapeutic activities;Balance training;Patient/family education;DME instruction;Stair training    PT Goals (Current goals can be found in the Care Plan section)  Acute Rehab PT Goals Patient Stated Goal: get home PT Goal Formulation: With patient Time For Goal Achievement: 11/25/17 Potential to Achieve Goals: Good    Frequency Min 3X/week   Barriers to discharge        Co-evaluation               AM-PAC PT "6 Clicks" Daily Activity  Outcome Measure Difficulty turning over in bed (including adjusting bedclothes, sheets and blankets)?: A Lot Difficulty moving from lying on back to sitting on the side of the bed? : A Lot Difficulty sitting down on and standing up from a chair with arms (e.g., wheelchair, bedside commode, etc,.)?: Unable Help needed moving to and from a bed to chair (including a wheelchair)?: A Little Help needed walking in hospital room?: A Little Help needed climbing 3-5 steps with a railing? : A Little 6 Click Score: 14    End of Session   Activity Tolerance: Patient tolerated treatment well;Other (comment)(limited by dizziness) Patient left: in bed;with call bell/phone within reach;with family/visitor present Nurse Communication: Mobility status PT Visit Diagnosis: Unsteadiness on feet (R26.81);Other abnormalities of gait and mobility (R26.89);Other (comment)(potential BPPV)    Time: 4098-1191 PT Time Calculation (min) (ACUTE ONLY): 34 min   Charges:   PT Evaluation $PT Eval Moderate Complexity: 1 Mod PT Treatments $Therapeutic Activity: 8-22 mins   PT G Codes:        06-Dec-2017  Ashburn Bing, PT [phone]  (pager)  Amy Aguirre 2017/12/06, 11:22 AM

## 2017-11-12 LAB — BASIC METABOLIC PANEL
ANION GAP: 12 (ref 5–15)
BUN: 24 mg/dL — ABNORMAL HIGH (ref 6–20)
CO2: 22 mmol/L (ref 22–32)
Calcium: 8.9 mg/dL (ref 8.9–10.3)
Chloride: 101 mmol/L (ref 101–111)
Creatinine, Ser: 1.15 mg/dL — ABNORMAL HIGH (ref 0.44–1.00)
GFR, EST AFRICAN AMERICAN: 47 mL/min — AB (ref >60)
GFR, EST NON AFRICAN AMERICAN: 41 mL/min — AB (ref >60)
Glucose, Bld: 168 mg/dL — ABNORMAL HIGH (ref 65–99)
POTASSIUM: 3.4 mmol/L — AB (ref 3.5–5.1)
Sodium: 135 mmol/L (ref 135–145)

## 2017-11-12 LAB — GLUCOSE, CAPILLARY
GLUCOSE-CAPILLARY: 160 mg/dL — AB (ref 65–99)
GLUCOSE-CAPILLARY: 126 mg/dL — AB (ref 65–99)
GLUCOSE-CAPILLARY: 108 mg/dL — AB (ref 65–99)
Glucose-Capillary: 139 mg/dL — ABNORMAL HIGH (ref 65–99)
Glucose-Capillary: 111 mg/dL — ABNORMAL HIGH (ref 65–99)

## 2017-11-12 NOTE — Progress Notes (Addendum)
Subjective: Mild nausea, no HA  Objective: Vital signs in last 24 hours: Temp:  [97.9 F (36.6 C)-99.1 F (37.3 C)] 98.7 F (37.1 C) (04/17 0836) Pulse Rate:  [55-75] 72 (04/17 0200) Resp:  [14-23] 20 (04/17 0600) BP: (148-192)/(51-78) 151/52 (04/17 0600) SpO2:  [92 %-100 %] 96 % (04/17 0200)    Intake/Output from previous day: 04/16 0701 - 04/17 0700 In: 100 [P.O.:100] Out: 300 [Urine:300] Intake/Output this shift: No intake/output data recorded.  General appearance: alert and cooperative Resp: clear to auscultation bilaterally Cardio: regular rate and rhythm GI: soft, non-tender; bowel sounds normal; no masses,  no organomegaly Extremities: calves soft  Neuro: awake and oriented, F/C  Lab Results: CBC  Recent Labs    11/10/17 1517 11/11/17 0259  WBC 11.3* 10.0  HGB 11.2* 10.4*  HCT 33.4* 30.2*  PLT 213 197   BMET Recent Labs    11/11/17 0259 11/12/17 0355  NA 139 135  K 4.4 3.4*  CL 105 101  CO2 20* 22  GLUCOSE 186* 168*  BUN 22* 24*  CREATININE 1.21* 1.15*  CALCIUM 9.5 8.9   PT/INR Recent Labs    11/10/17 1517  LABPROT 14.0  INR 1.09   ABG No results for input(s): PHART, HCO3 in the last 72 hours.  Invalid input(s): PCO2, PO2  Studies/Results: Ct Head Wo Contrast  Result Date: 11/11/2017 CLINICAL DATA:  Followup subdural hematoma. Dizziness. Larey Seat and struck head yesterday. EXAM: CT HEAD WITHOUT CONTRAST TECHNIQUE: Contiguous axial images were obtained from the base of the skull through the vertex without intravenous contrast. COMPARISON:  11/11/2017 FINDINGS: Brain: Since yesterday's exam, subarachnoid hemorrhage in the right supratentorial region is becoming less evident. Small amount of subarachnoid hemorrhage also in the left parieto-occipital region. Few small foci of subarachnoid blood do remain visible. Right-sided subdural hematoma, which is surprisingly low-density for an acute hematoma, is larger, measuring up to 8 mm in thickness.  No mass effect or shift. I do not see any intraparenchymal hemorrhage in the supratentorial brain. I think there is an inferior left cerebellar contusion with a small focus of blood products and some regional edema. Small amount of blood is present dependent within the occipital horns of the lateral ventricles. Ventricular size is stable. Vascular: There is atherosclerotic calcification of the major vessels at the base of the brain. Skull: Left occipital skull fracture as seen previously. Sinuses/Orbits: Clear/normal Other: None IMPRESSION: Subarachnoid blood in the sulci on the right in the supratentorial compartment and in the left parieto-occipital region is becoming less noticeable. Enlargement of a right-sided subdural hematoma along the convexity, measuring up to 8 mm in thickness. Relatively low-density for an acute subdural hematoma. No mass effect or shift. Contusion of the left cerebellum appears similar with mild edema and minimal blood products. Electronically Signed   By: Paulina Fusi M.D.   On: 11/11/2017 08:59   Ct Head Wo Contrast  Result Date: 11/10/2017 CLINICAL DATA:  Larey Seat and struck head. EXAM: CT HEAD WITHOUT CONTRAST CT CERVICAL SPINE WITHOUT CONTRAST TECHNIQUE: Multidetector CT imaging of the head and cervical spine was performed following the standard protocol without intravenous contrast. Multiplanar CT image reconstructions of the cervical spine were also generated. COMPARISON:  None. FINDINGS: CT HEAD FINDINGS Brain: A right frontoparietal acute to subacute subdural hematoma measures 3 mm in thickness and is associated with acute subarachnoid hemorrhage in the right temporal lobe. Low attenuation in the inferior right temporal lobe may be related to contusion. There is also low attenuation and  subarachnoid hemorrhage in the posterior left cerebellum suggesting contusion. No substantial midline shift. No hydrocephalus. Atrophy appears age-appropriate. No focal or discrete intra-axial  mass lesion evident. Vascular: Atherosclerotic calcification of the carotid siphons noted at the skull base. No dense MCA sign. Skull: Left occipital skull fracture extends from the foramen magnum up almost to the apex of the lambdoid suture. Sinuses/Orbits: The visualized paranasal sinuses and mastoid air cells are clear. Visualized portions of the globes and intraorbital fat are unremarkable. Other: Large left posterior parietooccipital scalp contusion evident. CT CERVICAL SPINE FINDINGS Alignment: Straightening of normal cervical lordosis. Skull base and vertebrae: No acute fracture. No primary bone lesion or focal pathologic process. Soft tissues and spinal canal: No prevertebral fluid or swelling. Acute hemorrhage is visible in the CSF space dependently at the level of the foramen magnum, tracking down into the craniocervical junction. Disc levels: Loss of disc height noted at C5-6. Trace anterolisthesis of C4 on 5 and C6 on 7 is compatible with the facet degeneration at these levels. Upper chest: Unremarkable. Other: None. IMPRESSION: 1. Small right frontal parietal acute to subacute subdural hematoma associated with subarachnoid hemorrhage and probable contusion in the inferior right temporal lobe. There is associated subarachnoid hemorrhage/contusion in the posterior left cerebellum with blood products layering dependently in the CSF space of the craniocervical junction. No substantial midline shift. 2. Nondisplaced left occipital skull fracture. 3. Degenerative changes in the cervical spine without fracture. 4. Loss of cervical lordosis. This can be related to patient positioning, muscle spasm or soft tissue injury. 5. Posterior left parietooccipital scalp contusion. Critical Value/emergent results were called by telephone at the time of interpretation on 11/10/2017 at 3:19 pm to Dr. Lajean ManesSiakecki, who verbally acknowledged these results. Electronically Signed   By: Kennith CenterEric  Mansell M.D.   On: 11/10/2017 15:21    Ct Cervical Spine Wo Contrast  Result Date: 11/10/2017 CLINICAL DATA:  Larey SeatFell and struck head. EXAM: CT HEAD WITHOUT CONTRAST CT CERVICAL SPINE WITHOUT CONTRAST TECHNIQUE: Multidetector CT imaging of the head and cervical spine was performed following the standard protocol without intravenous contrast. Multiplanar CT image reconstructions of the cervical spine were also generated. COMPARISON:  None. FINDINGS: CT HEAD FINDINGS Brain: A right frontoparietal acute to subacute subdural hematoma measures 3 mm in thickness and is associated with acute subarachnoid hemorrhage in the right temporal lobe. Low attenuation in the inferior right temporal lobe may be related to contusion. There is also low attenuation and subarachnoid hemorrhage in the posterior left cerebellum suggesting contusion. No substantial midline shift. No hydrocephalus. Atrophy appears age-appropriate. No focal or discrete intra-axial mass lesion evident. Vascular: Atherosclerotic calcification of the carotid siphons noted at the skull base. No dense MCA sign. Skull: Left occipital skull fracture extends from the foramen magnum up almost to the apex of the lambdoid suture. Sinuses/Orbits: The visualized paranasal sinuses and mastoid air cells are clear. Visualized portions of the globes and intraorbital fat are unremarkable. Other: Large left posterior parietooccipital scalp contusion evident. CT CERVICAL SPINE FINDINGS Alignment: Straightening of normal cervical lordosis. Skull base and vertebrae: No acute fracture. No primary bone lesion or focal pathologic process. Soft tissues and spinal canal: No prevertebral fluid or swelling. Acute hemorrhage is visible in the CSF space dependently at the level of the foramen magnum, tracking down into the craniocervical junction. Disc levels: Loss of disc height noted at C5-6. Trace anterolisthesis of C4 on 5 and C6 on 7 is compatible with the facet degeneration at these levels. Upper chest: Unremarkable.  Other: None. IMPRESSION: 1. Small right frontal parietal acute to subacute subdural hematoma associated with subarachnoid hemorrhage and probable contusion in the inferior right temporal lobe. There is associated subarachnoid hemorrhage/contusion in the posterior left cerebellum with blood products layering dependently in the CSF space of the craniocervical junction. No substantial midline shift. 2. Nondisplaced left occipital skull fracture. 3. Degenerative changes in the cervical spine without fracture. 4. Loss of cervical lordosis. This can be related to patient positioning, muscle spasm or soft tissue injury. 5. Posterior left parietooccipital scalp contusion. Critical Value/emergent results were called by telephone at the time of interpretation on 11/10/2017 at 3:19 pm to Dr. Lajean Manes, who verbally acknowledged these results. Electronically Signed   By: Kennith Center M.D.   On: 11/10/2017 15:21    Anti-infectives: Anti-infectives (From admission, onward)   None      Assessment/Plan: Fall TBI/occipital skull FX/R FP SDH/R temporal ICC/L cerebellar ICC - exam stable,  Dr. Jordan Likes following, TBI team therapies DM - SSI HTN - home meds FEN - carb mod diet, may need IVF if does not take in enough Dispo - therapies, BPPV testing. Also needs to be able to take PO well prior to D/C I spoke with her daughter  LOS: 0 days    [name], MD, MPH, FACS Trauma: [phone] General Surgery: [phone]  4/17/2019Patient ID: Amy Aguirre, female   DOB: [birth year], 84 y.o.   MRN: [id number]

## 2017-11-12 NOTE — Evaluation (Signed)
Speech Language Pathology Evaluation Patient Details Name: Amy NovakDorene Aguirre MRN: [id number] DOB: 06/26/1925 Today's Date: 11/12/2017 Time:  -     Problem List:  Patient Active Problem List   Diagnosis Date Noted  . Fall 11/10/2017   Past Medical History:  Past Medical History:  Diagnosis Date  . Diabetes mellitus without complication (HCC)   . HOH (hard of hearing)   . Hypertension   . PONV (postoperative nausea and vomiting)    Past Surgical History:  Past Surgical History:  Procedure Laterality Date  . ABDOMINAL HYSTERECTOMY    . CATARACT EXTRACTION W/PHACO Right 04/18/2015   Procedure: CATARACT EXTRACTION PHACO AND INTRAOCULAR LENS PLACEMENT (IOC);  Surgeon: Galen ManilaWilliam Porfilio, MD;  Location: [facility] ORS;  Service: Ophthalmology;  Laterality: Right;  US   00:58.9 AP    24.1 CDE   14.19 casette lot#  . CHOLECYSTECTOMY    . GANGLION CYST EXCISION     HPI:  pt is a 84 y/o female with pmh significant for DM, HTN admitted after unwitnessed mechanical fall backward striking the back of her head and elbows.  CT shows R SAH on the right and Left parieto-occipital region, enlarging right SDH and contusion of the left cerebellum.   Assessment / Plan / Recommendation Clinical Impression  Pt demonstrates likely baseline cognitive function. Mild impairment noted on MOCA, particularly in novel problem solving tasks suspected secondary to age related cognitve processing changes. With extra time and instruction pt able to complete tasks. Memory quite good, particularly with meanignful familiar tasks. Family present for assessment. SLP encouraged supervision assist for safety immediately after d/c with higher level problem solving, particularly medication. No SLP f/u needed will sign off.     SLP Assessment  SLP Recommendation/Assessment: Patient does not need any further Speech Lanaguage Pathology Services    Follow Up Recommendations       Frequency and Duration           SLP  Evaluation Cognition  Overall Cognitive Status: Within Functional Limits for tasks assessed Arousal/Alertness: Awake/alert Orientation Level: Oriented X4 Attention: Alternating Alternating Attention: Appears intact Memory: Appears intact Awareness: Appears intact Problem Solving: Impaired Problem Solving Impairment: Functional complex Executive Function: Landscape architectrganizing Organizing: Impaired Organizing Impairment: Functional complex Safety/Judgment: Appears intact       Comprehension  Auditory Comprehension Overall Auditory Comprehension: Appears within functional limits for tasks assessed    Expression Verbal Expression Overall Verbal Expression: Appears within functional limits for tasks assessed   Oral / Motor  Oral Motor/Sensory Function Overall Oral Motor/Sensory Function: Within functional limits Motor Speech Overall Motor Speech: Appears within functional limits for tasks assessed   GO                   Harlon DittyBonnie Ifeoluwa Beller, MA CCC-SLP [phone]  Claudine MoutonDeBlois, [name] [name] 11/12/2017, 12:16 PM

## 2017-11-13 LAB — BASIC METABOLIC PANEL
ANION GAP: 11 (ref 5–15)
BUN: 29 mg/dL — ABNORMAL HIGH (ref 6–20)
CALCIUM: 8.8 mg/dL — AB (ref 8.9–10.3)
CO2: 25 mmol/L (ref 22–32)
CREATININE: 1.24 mg/dL — AB (ref 0.44–1.00)
Chloride: 98 mmol/L — ABNORMAL LOW (ref 101–111)
GFR, EST AFRICAN AMERICAN: 43 mL/min — AB (ref >60)
GFR, EST NON AFRICAN AMERICAN: 37 mL/min — AB (ref >60)
Glucose, Bld: 105 mg/dL — ABNORMAL HIGH (ref 65–99)
Potassium: 3.7 mmol/L (ref 3.5–5.1)
SODIUM: 134 mmol/L — AB (ref 135–145)

## 2017-11-13 LAB — GLUCOSE, CAPILLARY
GLUCOSE-CAPILLARY: 91 mg/dL (ref 65–99)
Glucose-Capillary: 204 mg/dL — ABNORMAL HIGH (ref 65–99)

## 2017-11-13 MED ORDER — GLIMEPIRIDE 2 MG PO TABS: 2.0000 mg | ORAL_TABLET | ORAL | Status: DC

## 2017-11-13 NOTE — Progress Notes (Signed)
  Subjective: Ate and drank much better yesterday  Objective: Vital signs in last 24 hours: Temp:  [97.9 F (36.6 C)-98.7 F (37.1 C)] 98.1 F (36.7 C) (04/18 0742) Pulse Rate:  [58-70] 63 (04/18 0742) Resp:  [13-18] 13 (04/18 0742) BP: (127-172)/(59-85) 172/72 (04/18 0742) SpO2:  [94 %-98 %] 96 % (04/18 0742) Last BM Date: (PTA)  Intake/Output from previous day: 04/17 0701 - 04/18 0700 In: 530 [P.O.:530] Out: 550 [Urine:550] Intake/Output this shift: No intake/output data recorded.  General appearance: alert and cooperative Resp: clear to auscultation bilaterally Cardio: regular rate and rhythm GI: soft, NT, ND Extremities: calves soft  Neuro: alert, PERL, MAE, F/C, speech fluent  Lab Results: CBC  Recent Labs    11/10/17 1517 11/11/17 0259  WBC 11.3* 10.0  HGB 11.2* 10.4*  HCT 33.4* 30.2*  PLT 213 197   BMET Recent Labs    11/12/17 0355 11/13/17 0415  NA 135 134*  K 3.4* 3.7  CL 101 98*  CO2 22 25  GLUCOSE 168* 105*  BUN 24* 29*  CREATININE 1.15* 1.24*  CALCIUM 8.9 8.8*   PT/INR Recent Labs    11/10/17 1517  LABPROT 14.0  INR 1.09   ABG No results for input(s): PHART, HCO3 in the last 72 hours.  Invalid input(s): PCO2, PO2  Studies/Results: Ct Head Wo Contrast  Result Date: 11/11/2017 CLINICAL DATA:  Followup subdural hematoma. Dizziness. Larey SeatFell and struck head yesterday. EXAM: CT HEAD WITHOUT CONTRAST TECHNIQUE: Contiguous axial images were obtained from the base of the skull through the vertex without intravenous contrast. COMPARISON:  11/11/2017 FINDINGS: Brain: Since yesterday's exam, subarachnoid hemorrhage in the right supratentorial region is becoming less evident. Small amount of subarachnoid hemorrhage also in the left parieto-occipital region. Few small foci of subarachnoid blood do remain visible. Right-sided subdural hematoma, which is surprisingly low-density for an acute hematoma, is larger, measuring up to 8 mm in thickness. No  mass effect or shift. I do not see any intraparenchymal hemorrhage in the supratentorial brain. I think there is an inferior left cerebellar contusion with a small focus of blood products and some regional edema. Small amount of blood is present dependent within the occipital horns of the lateral ventricles. Ventricular size is stable. Vascular: There is atherosclerotic calcification of the major vessels at the base of the brain. Skull: Left occipital skull fracture as seen previously. Sinuses/Orbits: Clear/normal Other: None IMPRESSION: Subarachnoid blood in the sulci on the right in the supratentorial compartment and in the left parieto-occipital region is becoming less noticeable. Enlargement of a right-sided subdural hematoma along the convexity, measuring up to 8 mm in thickness. Relatively low-density for an acute subdural hematoma. No mass effect or shift. Contusion of the left cerebellum appears similar with mild edema and minimal blood products. Electronically Signed   By: Paulina FusiMark  Shogry M.D.   On: 11/11/2017 08:59    Anti-infectives: Anti-infectives (From admission, onward)   None      Assessment/Plan: Fall TBI/occipital skull FX/R FP SDH/R temporal ICC/L cerebellar ICC - exam stable,  Dr. Jordan LikesPool consulted, TBI team therapies DM - SSI HTN - home meds FEN - carb mod diet, PO intake now good Dispo - therapies, BPPV testing. Great family support so may be able to D/C later today   LOS: 0 days    Violeta GelinasBurke Eliya Bubar, MD, MPH, FACS Trauma: [phone] General Surgery: [phone]  4/18/2019Patient ID: Amy Aguirre, female   DOB: 05/03/1925, 84 y.o.   MRN: [id number]

## 2017-11-13 NOTE — Discharge Summary (Signed)
[facility] Discharge Summary   Patient ID: Amy Aguirre MRN: [id number] DOB/AGE: 03/14/1925 84 y.o.  Admit date: 11/10/2017 Discharge date: 11/13/2017  Admitting Diagnosis: Fall Occipital skull fracture Small SDH right Small SAH Left scalp contusion HTN DM  Discharge Diagnosis Patient Active Problem List   Diagnosis Date Noted  . Fall 11/10/2017    Consultants Neurosurgery  Imaging: No results found.  Procedures None  Hospital Course:  Amy Aguirre is a 84yo female who presented to [facility] 4/15 after sustaining a ground level fall at home. Patient fell at home today trying to pick up a bucket.  She knows she didn't stand all the way back up, but doesn't recall losing her balance or getting dizzy.  She does know that she fell backwards and hit her elbows and the back of her head.  She did not pass out.  She does not feel dizzy now. She denies visual changes. She does have a headache "across her eyes."  She denies nausea or vomiting.  She denies chest pain or shortness of breath. She has significant hypertension.  She was [facility] ED by her daughter.  She was found to have an occipital skull fracture, small SDH right, small SAH, and left scalp contusion. One of her other daughters works here and she desired transfer to [employer]. Patient was admitted to the trauma ICU for monitoring. Neurosurgery was consulted and recommended nonoperative management for head injury. Follow up head CT stable per neurosurgery. Patient worked with therapies during this admission and recommended discharge home with home health PT/OT at discharge.  She tested positive for BPPV and orthostatic hypotension so she will be referred for outpatient vestibular rehabilitation. On 4/18 the patient was voiding well, tolerating diet, mobilizing well, pain well controlled, vital signs stable and felt stable for discharge home.  Patient will follow up as below and knows to call with questions or  concerns.     Allergies as of 11/13/2017   No Known Allergies     Medication List    TAKE these medications   aspirin EC 81 MG tablet Take 81 mg by mouth at bedtime.   atenolol 100 MG tablet Commonly known as:  TENORMIN Take 100 mg by mouth daily.   CALCIUM+D3 PO Take 1 tablet by mouth at bedtime. Oscal   diphenhydramine-acetaminophen 25-500 MG Tabs tablet Commonly known as:  TYLENOL PM Take 1 tablet by mouth at bedtime as needed (sleep).   glimepiride 2 MG tablet Commonly known as:  AMARYL Take 1 tablet (2 mg total) by mouth every Monday, Wednesday, and Friday. Start taking on:  11/14/2017 What changed:    when to take this  Another medication with the same name was removed. Continue taking this medication, and follow the directions you see here.   indapamide 2.5 MG tablet Commonly known as:  LOZOL Take 2.5 mg by mouth daily.   indomethacin 25 MG capsule Commonly known as:  INDOCIN Take 25 mg by mouth daily as needed (gout).   irbesartan 150 MG tablet Commonly known as:  AVAPRO Take 150 mg by mouth daily.   IRON PO Take 1 tablet by mouth at bedtime.   metFORMIN 500 MG tablet Commonly known as:  GLUCOPHAGE Take 500 mg by mouth 2 (two) times daily with a meal.   mirtazapine 15 MG tablet Commonly known as:  REMERON Take 15 mg by mouth at bedtime.   multivitamin with minerals Tabs tablet Take 1 tablet by mouth daily. Centrum Silver  potassium chloride 10 MEQ tablet Commonly known as:  K-DUR,KLOR-CON Take 10 mEq by mouth daily.   simvastatin 40 MG tablet Commonly known as:  ZOCOR Take 40 mg by mouth daily.        Follow-up Information    Julio SicksPool, Henry, MD. Call.   Specialty:  Neurosurgery Why:  call to arrange follow up regarding your recent head injury Contact information: [address] [place] [address] [phone]        Jaclyn Shaggyate, Denny C, MD. Call.   Specialty:  Internal Medicine Why:  call to arrange follow up with  your primary care physician regarding your recent hospital admission Contact information: [address]   [place] [address] [phone]           Signed: Franne FortsBrooke A Linda Biehn, Dr [facility] 11/13/2017, 2:40 PM Pager: [phone] Consults: [phone] Mon-Fri 7:00 am-4:30 pm Sat-Sun 7:00 am-11:30 am

## 2017-11-13 NOTE — Clinical Social Work Note (Signed)
Clinical Social Worker met with patient and patient son at bedside to offer support and discuss patient needs at discharge.  Patient states that her daughter works in Games developer for [employer] and is planning to work from home and assist patient upon discharge.  Patient other two children live in very close proximity and are available to assist as needed.    Clinical Social Worker inquired about current substance use.  Patient and patient son both adamantly state that patient does not drink alcohol or use drugs.  SBIRT complete with no concerns and no resources necessary at this time.  Clinical Social Worker will sign off for now as social work intervention is no longer needed. Please consult [place] again if new need arises.  Barbette Or, [place]

## 2017-11-13 NOTE — Discharge Instructions (Addendum)
Dear Ms. Amy Aguirre you will find some information regarding traumatic brain injury, orthostatic hypotension, and Vertigo. The take home from all these instructions is to take it easy! Our biggest recommendation is to go slow from laying to sitting and then go slow from sitting to standing. It was a pleasure taking care of you. Warm regards, The Trauma Team  Living With Traumatic Brain Injury Traumatic brain injury (TBI) is an injury to the brain that may be mild, moderate, or severe. Symptoms of any type of TBI can be long lasting (chronic). Depending on the area of the brain that is affected, a TBI can interfere with vision, memory, concentration, speech, balance, sense of touch, and sleep. TBI can also cause chronic symptoms like headache or dizziness. How to cope with lifestyle changes After a TBI, you may need to make changes to your lifestyle in order to recover as well as possible. How quickly and how fully you recover will depend on the severity of your injury. Your recovery plan may involve:  Working with specialists to develop a rehabilitation plan to help you return to your regular activities. Your health care team may include: Physi Orthostatic Hypotension Orthostatic hypotension is a sudden drop in blood pressure that happens when you quickly change positions, such as when you get up from a seated or lying position. Blood pressure is a measurement of how strongly, or weakly, your blood is pressing against the walls of your arteries. Arteries are blood vessels that carry blood from your heart throughout your body. When blood pressure is too low, you may not get enough blood to your brain or to the rest of your organs. This can cause weakness, light-headedness, rapid heartbeat, and fainting. This can last for just a few seconds or for up to a few minutes. Orthostatic hypotension is usually not a serious problem. However, if it happens frequently or gets worse, it may be a sign of  something more serious. What are the causes? This condition may be caused by: Sudden changes in posture, such as standing up quickly after you have been sitting or lying down. Blood loss. Loss of body fluids (dehydration). Heart problems. Hormone (endocrine) problems. Pregnancy. Severe infection. Lack of certain nutrients. Severe allergic reactions (anaphylaxis). Certain medicines, such as blood pressure medicine or medicines that make the body lose excess fluids (diuretics). Sometimes, this condition can be caused by not taking medicine as directed, such as taking too much of a certain medicine.  What increases the risk? Certain factors can make you more likely to develop orthostatic hypotension, including: Age. Risk increases as you get older. Conditions that affect the heart or the central nervous system. Taking certain medicines, such as blood pressure medicine or diuretics. Being pregnant.  What are the signs or symptoms? Symptoms of this condition may include: Weakness. Light-headedness. Dizziness. Blurred vision. Fatigue. Rapid heartbeat. Fainting, in severe cases.  How is this diagnosed? This condition is diagnosed based on: Your medical history. Your symptoms. Your blood pressure measurement. Your health care provider will check your blood pressure when you are: Lying down. Sitting. Standing.  A blood pressure reading is recorded as two numbers, such as "120 over 80" (or 120/80). The first ("top") number is called the systolic pressure. It is a measure of the pressure in your arteries as your heart beats. The second ("bottom") number is called the diastolic pressure. It is a measure of the pressure in your arteries when your heart relaxes between beats. Blood pressure is measured  in a unit called mm Hg. Healthy blood pressure for adults is 120/80. If your blood pressure is below 90/60, you may be diagnosed with hypotension. Other information or tests that may be used  to diagnose orthostatic hypotension include: Your other vital signs, such as your heart rate and temperature. Blood tests. Tilt table test. For this test, you will be safely secured to a table that moves you from a lying position to an upright position. Your heart rhythm and blood pressure will be monitored during the test.  How is this treated? Treatment for this condition may include: Changing your diet. This may involve eating more salt (sodium) or drinking more water. Taking medicines to raise your blood pressure. Changing the dosage of certain medicines you are taking that might be lowering your blood pressure. Wearing compression stockings. These stockings help to prevent blood clots and reduce swelling in your legs.  In some cases, you may need to go to the hospital for: Fluid replacement. This means you will receive fluids through an IV tube. Blood replacement. This means you will receive donated blood through an IV tube (transfusion). Treating an infection or heart problems, if this applies. Monitoring. You may need to be monitored while medicines that you are taking wear off.  Follow these instructions at home: Eating and drinking  Drink enough fluid to keep your urine clear or pale yellow. Eat a healthy diet and follow instructions from your health care provider about eating or drinking restrictions. A healthy diet includes: Fresh fruits and vegetables. Whole grains. Lean meats. Low-fat dairy products. Eat extra salt only as directed. Do not add extra salt to your diet unless your health care provider told you to do that. Eat frequent, small meals. Avoid standing up suddenly after eating. Medicines Take over-the-counter and prescription medicines only as told by your health care provider. Follow instructions from your health care provider about changing the dosage of your current medicines, if this applies. Do not stop or adjust any of your medicines on your own. General  instructions Wear compression stockings as told by your health care provider. Get up slowly from lying down or sitting positions. This gives your blood pressure a chance to adjust. Avoid hot showers and excessive heat as directed by your health care provider. Return to your normal activities as told by your health care provider. Ask your health care provider what activities are safe for you. Do not use any products that contain nicotine or tobacco, such as cigarettes and e-cigarettes. If you need help quitting, ask your health care provider. Keep all follow-up visits as told by your health care provider. This is important. Contact a health care provider if: You vomit. You have diarrhea. You have a fever for more than 2-3 days. You feel more thirsty than usual. You feel weak and tired. Get help right away if: You have chest pain. You have a fast or irregular heartbeat. You develop numbness in any part of your body. You cannot move your arms or your legs. You have trouble speaking. You become sweaty or feel lightheaded. You faint. You feel short of breath. You have trouble staying awake. You feel confused. This information is not intended to replace advice given to you by your health care provider. Make sure you discuss any questions you have with your health care provider. Document Released: 07/05/2002 Document Revised: 04/02/2016 Document Reviewed: 01/05/2016 Elsevier Interactive Patient Education  2018 [employer]. ? cal or occupational therapists. ? Speech and language pathologists. ?  Mental health counselors. ? Physicians like your primary care physician or neurologist.  Taking time off work or school, depending on your injury.  Avoiding situations where there is a risk for another head injury, such as football, hockey, soccer, basketball, martial arts, downhill snow sports, and horseback riding. Do not do these activities until your health care provider approves.  Resting.  Rest helps the brain to heal. Make sure you: ? Get plenty of sleep at night. Avoid staying up late at night. ? Keep the same bedtime hours on weekends and weekdays. ? Rest during the day. Take daytime naps or rest breaks when you feel tired.  Avoiding extra stress on your eyes. You may need to set time limits when working on the computer, watching TV, and reading.  Finding ways to manage stress. This may include: ? Avoiding activities that cause stress. ? Deep breathing, yoga, or meditation. ? Listening to music or spending time outdoors.  Making lists, setting reminders, or using a day planner to help your memory.  Allowing yourself plenty of time to complete everyday tasks, such as grocery shopping, paying bills, and doing laundry.  Avoiding driving. Your ability to drive safely may be affected by your injury. ? Rely on family, friends, or a transportation service to help you get around and to appointments. ? Have a professional evaluation to check your driving ability. ? Access support services to help you return to driving. These may include training and adaptive equipment.  Follow these instructions at home:  Take over-the-counter and prescription medicines only as told by your health care provider. Do not take aspirin or other anti-inflammatory medicines such as ibuprofen or naproxen unless approved by your health care provider.  Avoid large amounts of caffeine. Your body may be more sensitive to it after your injury.  Do not use any products that contain nicotine or tobacco, such as cigarettes, e-cigarettes, nicotine gum, and patches. If you need help quitting, ask your health care provider.  Do not use drugs.  Limit alcohol intake to no more than 1 drink per day for nonpregnant women and 2 drinks per day for men. One drink equals 12 ounces of beer, 5 ounces of wine, or 1 ounces of hard liquor.  Do not drive until cleared by your health care provider.  Keep all follow-up  visits as told by your health care provider. This is important. Where to find support:  Talk with your employer, co-workers, teachers, or school counselor about your injury. Work together to develop a plan for completing tasks while you recover.  Talk to others living with a TBI. Join a support group with other people who have experienced a TBI.  Let your friends and family members know what they can do to help. This might include helping at home or transportation to appointments.  If you are unable to continue working after your injury, talk to a Child psychotherapist about options to help you meet your financial needs.  Seek out additional resources if you are a Building services engineer or family member, such as: ? Development worker, international aid Injury Center: dvbic.dcoe.mil ? Department of [employer]: 6407639994 Questions to ask your health care provider:  How serious is my injury?  What is my rehabilitation plan?  What is my expected recovery?  When can I return to work or school?  When can I return to regular activities, including driving? Contact a health care provider if:  You have new or worsening: ?  Dizziness. ? Headache. ? Anxiety or depression. ? Irritability. ? Confusion. ? Jerky movements that you cannot control (seizures). ? Extreme sensitivity to light or sound. ? Nausea or vomiting. Summary  Traumatic brain injury (TBI) is an injury to your brain that can interfere with vision, memory, concentration, speech, balance, sense of touch, and sleep. TBI can also cause chronic symptoms like headache or dizziness.  After a TBI you may need to make several changes to your lifestyle in order to recover as well as possible. How quickly and how fully you recover will depend on the severity of your injury.  Talk to your family, friends, employer, co-workers, Architectural technologistteachers, or school counselor about your injury. Work together to develop a plan for  completing tasks while you recover. This information is not intended to replace advice given to you by your health care provider. Make sure you discuss any questions you have with your health care provider. Document Released: 07/11/2016 Document Revised: 07/11/2016 Document Reviewed: 07/11/2016 Elsevier Interactive Patient Education  2018 [employer] Benign Positional Vertigo Vertigo is the feeling that you or your surroundings are moving when they are not. Benign positional vertigo is the most common form of vertigo. The cause of this condition is not serious (is benign). This condition is triggered by certain movements and positions (is positional). This condition can be dangerous if it occurs while you are doing something that could endanger you or others, such as driving. What are the causes? In many cases, the cause of this condition is not known. It may be caused by a disturbance in an area of the inner ear that helps your brain to sense movement and balance. This disturbance can be caused by a viral infection (labyrinthitis), head injury, or repetitive motion. What increases the risk? This condition is more likely to develop in:  Women.  People who are 82 years of age or older.  What are the signs or symptoms? Symptoms of this condition usually happen when you move your head or your eyes in different directions. Symptoms may start suddenly, and they usually last for less than a minute. Symptoms may include:  Loss of balance and falling.  Feeling like you are spinning or moving.  Feeling like your surroundings are spinning or moving.  Nausea and vomiting.  Blurred vision.  Dizziness.  Involuntary eye movement (nystagmus).  Symptoms can be mild and cause only slight annoyance, or they can be severe and interfere with daily life. Episodes of benign positional vertigo may return (recur) over time, and they may be triggered by certain movements. Symptoms may improve over  time. How is this diagnosed? This condition is usually diagnosed by medical history and a physical exam of the head, neck, and ears. You may be referred to a health care provider who specializes in ear, nose, and throat (ENT) problems (otolaryngologist) or a provider who specializes in disorders of the nervous system (neurologist). You may have additional testing, including:  MRI.  A CT scan.  Eye movement tests. Your health care provider may ask you to change positions quickly while he or she watches you for symptoms of benign positional vertigo, such as nystagmus. Eye movement may be tested with an electronystagmogram (ENG), caloric stimulation, the Dix-Hallpike test, or the roll test.  An electroencephalogram (EEG). This records electrical activity in your brain.  Hearing tests.  How is this treated? Usually, your health care provider will treat this by moving your head in specific positions to adjust your inner ear back to  normal. Surgery may be needed in severe cases, but this is rare. In some cases, benign positional vertigo may resolve on its own in 2-4 weeks. Follow these instructions at home: Safety  Move slowly.Avoid sudden body or head movements.  Avoid driving.  Avoid operating heavy machinery.  Avoid doing any tasks that would be dangerous to you or others if a vertigo episode would occur.  If you have trouble walking or keeping your balance, try using a cane for stability. If you feel dizzy or unstable, sit down right away.  Return to your normal activities as told by your health care provider. Ask your health care provider what activities are safe for you. General instructions  Take over-the-counter and prescription medicines only as told by your health care provider.  Avoid certain positions or movements as told by your health care provider.  Drink enough fluid to keep your urine clear or pale yellow.  Keep all follow-up visits as told by your health care  provider. This is important. Contact a health care provider if:  You have a fever.  Your condition gets worse or you develop new symptoms.  Your family or friends notice any behavioral changes.  Your nausea or vomiting gets worse.  You have numbness or a pins and needles sensation. Get help right away if:  You have difficulty speaking or moving.  You are always dizzy.  You faint.  You develop severe headaches.  You have weakness in your legs or arms.  You have changes in your hearing or vision.  You develop a stiff neck.  You develop sensitivity to light. This information is not intended to replace advice given to you by your health care provider. Make sure you discuss any questions you have with your health care provider. Document Released: 04/22/2006 Document Revised: 12/21/2015 Document Reviewed: 11/07/2014 Elsevier Interactive Patient Education  [employer].

## 2017-11-13 NOTE — Progress Notes (Signed)
Occupational Therapy Treatment Patient Details Name: Amy Aguirre MRN: [id number] DOB: 1925-07-23 Today's Date: 11/13/2017    History of present illness pt is a 84 y/o female with pmh significant for DM, HTN admitted after unwitnessed mechanical fall backward striking the back of her head and elbows.  CT shows R SAH on the right and Left parieto-occipital region, enlarging right SDH and contusion of the left cerebellum.   OT comments  Pt making good progress with functional goals, very pleasant and cooperative. OT will continue to follow acutely  Follow Up Recommendations  Outpatient OT;Supervision/Assistance - 24 hour    Equipment Recommendations  None recommended by OT;Tub/shower bench    Recommendations for Other Services      Precautions / Restrictions Precautions Precautions: Fall Restrictions Weight Bearing Restrictions: No       Mobility Bed Mobility Overal bed mobility: Needs Assistance Bed Mobility: Supine to Sit   Sidelying to sit: Min assist Supine to sit: Min guard     General bed mobility comments: assist only due to positioning for testing for BPPV  Transfers Overall transfer level: Needs assistance Equipment used: None Transfers: Sit to/from Stand Sit to Stand: Min guard;Min assist         General transfer comment: to steady when standing using hand on armrests    Balance Overall balance assessment: Needs assistance Sitting-balance support: Feet supported;No upper extremity supported Sitting balance-Leahy Scale: Good Sitting balance - Comments: no UE support edge of chair for vestibular testing    Standing balance support: No upper extremity supported;During functional activity;Single extremity supported Standing balance-Leahy Scale: Poor Standing balance comment: needed external support; and dropped BP in static standing                           ADL either performed or assessed with clinical judgement   ADL       Grooming:  Brushing hair;Oral care;Sitting;Wash/dry hands;Wash/dry face;Standing;Min guard       Lower Body Bathing: Minimal assistance;Sit to/from stand       Lower Body Dressing: Minimal assistance;Sit to/from stand   Toilet Transfer: Minimal assistance;Min guard;Ambulation;RW;Grab bars;Comfort height toilet;Cueing for safety   Toileting- Clothing Manipulation and Hygiene: Sit to/from stand;Minimal assistance       Functional mobility during ADLs: Minimal assistance;Min guard;Rolling walker;Cueing for safety       Vision Baseline Vision/History: Wears glasses;Cataracts Wears Glasses: At all times Patient Visual Report: No change from baseline     Perception     Praxis      Cognition Arousal/Alertness: Awake/alert Behavior During Therapy: WFL for tasks assessed/performed;Impulsive Overall Cognitive Status: History of cognitive impairments - at baseline                                 General Comments: likely at basline, but hyperverbal, decreased awareness and some impulsivity        Exercises     Shoulder Instructions       General Comments daughter and son in room and educated about 24 hour S and fall risk as well as impulsivity and decreased awareness; also educated about BPPV and typically more "off" for first 24-48 hours    Pertinent Vitals/ Pain       Pain Assessment: No/denies pain Faces Pain Scale: No hurt Pain Intervention(s): Monitored during session;Repositioned  Home Living  Prior Functioning/Environment              Frequency  Min 2X/week        Progress Toward Goals  OT Goals(current goals can now be found in the care plan section)  Progress towards OT goals: Progressing toward goals  Acute Rehab OT Goals Patient Stated Goal: get home  Plan Discharge plan remains appropriate    Co-evaluation                 AM-PAC PT "6 Clicks" Daily Activity     Outcome  Measure   Help from another person eating meals?: A Little Help from another person taking care of personal grooming?: A Little Help from another person toileting, which includes using toliet, bedpan, or urinal?: A Little Help from another person bathing (including washing, rinsing, drying)?: A Little Help from another person to put on and taking off regular upper body clothing?: A Little Help from another person to put on and taking off regular lower body clothing?: A Lot 6 Click Score: 17    End of Session Equipment Utilized During Treatment: Gait belt  OT Visit Diagnosis: Unsteadiness on feet (R26.81);History of falling (Z91.81);Other abnormalities of gait and mobility (R26.89);Dizziness and giddiness (R42);Pain;Other symptoms and signs involving the nervous system (R29.898)   Activity Tolerance Patient limited by fatigue   Patient Left in chair;with call bell/phone within reach   Nurse Communication      Functional Assessment Tool Used: AM-PAC 6 Clicks Daily Activity   Time: [id number] OT Time Calculation (min): 29 min  Charges: OT G-codes **NOT FOR INPATIENT CLASS** Functional Assessment Tool Used: AM-PAC 6 Clicks Daily Activity OT General Charges $OT Visit: 1 Visit OT Treatments $Self Care/Home Management : 113-127 mins $Therapeutic Activity: 8-22 mins     Amy Aguirre, Amy Aguirre 11/13/2017, 2:40 PM

## 2017-11-13 NOTE — Progress Notes (Addendum)
Physical Therapy Treatment Patient Details Name: Amy Aguirre MRN: [id number] DOB: 02-02-1925 Today's Date: 11/13/2017    History of Present Illness pt is a 84 y/o female with pmh significant for DM, HTN admitted after unwitnessed mechanical fall backward striking the back of her head and elbows.  CT shows R SAH on the right and Left parieto-occipital region, enlarging right SDH and contusion of the left cerebellum.    PT Comments    Patient progressing with ambulation, though high fall risk and needing to use walker with assist in the home.  Time spent to educate pt's family in level of assist for safety and fall prevention including footwear, lighting, keeping items used frequently stored within reach and shower set up.  Patient positive for R posterior canal BPPV and needing canalith repositioning x 2 for appropriate treatment.  Feel she will be best served with outpatient PT follow up with vestibular rehab.   Also pt was orthostatic this session with BP's noted below.  She was symptomatic initially.  Seated BP prior to mobilizing 128/61 Seated after standing and symptomatic 100/65 Standing 85/55   Follow Up Recommendations  Outpatient PT(vestibular rehab)     Equipment Recommendations  None recommended by PT;Other (comment)    Recommendations for Other Services       Precautions / Restrictions Precautions Precautions: Fall    Mobility  Bed Mobility       Sidelying to sit: Min assist Supine to sit: Min assist     General bed mobility comments: assist only due to positioning for testing for BPPV  Transfers Overall transfer level: Needs assistance Equipment used: None Transfers: Sit to/from Stand Sit to Stand: Min guard;Min assist         General transfer comment: to steady when standing using hand on armrests  Ambulation/Gait Ambulation/Gait assistance: Min assist;Mod assist Ambulation Distance (Feet): 130 Feet Assistive device: None Gait  Pattern/deviations: Step-through pattern;Decreased stride length;Drifts right/left;Shuffle     General Gait Details: imbalance needing assist for safety; to prevent falls   Stairs             Wheelchair Mobility    Modified Rankin (Stroke Patients Only)       Balance Overall balance assessment: Needs assistance   Sitting balance-Leahy Scale: Good Sitting balance - Comments: no UE support edge of chair for vestibular testing    Standing balance support: No upper extremity supported Standing balance-Leahy Scale: Poor Standing balance comment: needed external support; and dropped BP in static standing                            Cognition Arousal/Alertness: Awake/alert Behavior During Therapy: WFL for tasks assessed/performed;Impulsive Overall Cognitive Status: History of cognitive impairments - at baseline                                 General Comments: likely at basline, but hyperverbal, decreased awareness and some impulsivity      Exercises      General Comments General comments (skin integrity, edema, etc.): daughter and son in room and educated about 24 hour S and fall risk as well as impulsivity and decreased awareness; also educated about BPPV and typically more "off" for first 24-48 hours   Vestibular Assessment - 11/13/17 0001      Vestibular Assessment   General Observation  reports feeling of falling when lying down  Symptom Behavior   Type of Dizziness  Imbalance    Frequency of Dizziness  intermittent    Duration of Dizziness  seconds    Aggravating Factors  Comment sit to supine    Relieving Factors  Rest;Slow movements      Occulomotor Exam   Occulomotor Alignment  Normal    Spontaneous  Absent    Gaze-induced  Absent    Smooth Pursuits  Intact    Saccades  Intact;Slow    Comment  wears bifocals      Vestibulo-Occular Reflex   VOR 1 Head Only (x 1 viewing)  horizontal and vertical head movements 30sec each  without symptoms    VOR to Slow Head Movement  Positive right    VOR Cancellation  Normal      Auditory   Comments  HOH at baseline      Positional Testing   Sidelying Test  Sidelying Right;Sidelying Left      Sidelying Right   Sidelying Right Duration  30 sec    Sidelying Right Symptoms  Upbeat, right rotatory nystagmus           Pertinent Vitals/Pain Pain Assessment: No/denies pain    Home Living                      Prior Function            PT Goals (current goals can now be found in the care plan section) Progress towards PT goals: Progressing toward goals    Frequency    Min 3X/week      PT Plan Current plan remains appropriate    Co-evaluation              AM-PAC PT "6 Clicks" Daily Activity  Outcome Measure  Difficulty turning over in bed (including adjusting bedclothes, sheets and blankets)?: A Lot Difficulty moving from lying on back to sitting on the side of the bed? : A Lot Difficulty sitting down on and standing up from a chair with arms (e.g., wheelchair, bedside commode, etc,.)?: Unable Help needed moving to and from a bed to chair (including a wheelchair)?: A Little Help needed walking in hospital room?: A Little Help needed climbing 3-5 steps with a railing? : A Little 6 Click Score: 14    End of Session Equipment Utilized During Treatment: Gait belt Activity Tolerance: Patient tolerated treatment well Patient left: with call bell/phone within reach;with chair alarm set;with family/visitor present   PT Visit Diagnosis: Unsteadiness on feet (R26.81);Other abnormalities of gait and mobility (R26.89);Other (comment);BPPV BPPV - Right/Left : Right     Time: 1040-1131 PT Time Calculation (min) (ACUTE ONLY): 51 min  Charges:  $Gait Training: 8-22 mins $Neuromuscular Re-education: 8-22 mins $Canalith Rep Proc: 8-22 mins                    G CodesSheran Lawless:       Cyndi Teasha [place], [place] CarolinaPT [phone] 11/13/2017    Elray Mcgregorynthia Iliyana Convey 11/13/2017,  1:35 PM

## 2017-11-13 NOTE — Progress Notes (Signed)
Patient d/c instructions given to patient/family, including prescription for RW.  IVs removed.  All belongings with family. Patient taken to car via wheelchair.

## 2017-12-02 ENCOUNTER — Encounter: Payer: Self-pay | Admitting: Occupational Therapy

## 2017-12-02 ENCOUNTER — Ambulatory Visit: Payer: Medicare Other | Attending: General Surgery | Admitting: Occupational Therapy

## 2017-12-02 ENCOUNTER — Encounter: Payer: Self-pay | Admitting: Physical Therapy

## 2017-12-02 ENCOUNTER — Other Ambulatory Visit: Payer: Self-pay

## 2017-12-02 ENCOUNTER — Ambulatory Visit: Payer: Medicare Other | Admitting: Physical Therapy

## 2017-12-02 DIAGNOSIS — M6281 Muscle weakness (generalized): Secondary | ICD-10-CM | POA: Insufficient documentation

## 2017-12-02 NOTE — Therapy (Signed)
[place] [facility] MAIN [facility] SERVICES [address] [place], [place], [address] Phone: [phone]   Fax:  [phone]  Physical Therapy Evaluation  Patient Details  Name: Koi Yarbro MRN: [id number] Date of Birth: 07-27-1925 Referring Provider: [name]   Encounter Date: 12/02/2017  PT End of Session - 12/02/17 1019    Visit Number  1    Number of Visits  1    Date for PT Re-Evaluation  12/02/17    PT Start Time  1000    PT Stop Time  1030    PT Time Calculation (min)  30 min    Equipment Utilized During Treatment  Gait belt    Activity Tolerance  Patient tolerated treatment well    Behavior During Therapy  [facility] for tasks assessed/performed       Past Medical History:  Diagnosis Date  . Diabetes mellitus without complication (HCC)   . HOH (hard of hearing)   . Hypertension   . PONV (postoperative nausea and vomiting)     Past Surgical History:  Procedure Laterality Date  . ABDOMINAL HYSTERECTOMY    . CATARACT EXTRACTION W/PHACO Right 04/18/2015   Procedure: CATARACT EXTRACTION PHACO AND INTRAOCULAR LENS PLACEMENT (IOC);  Surgeon: Galen Manila, MD;  Location: [facility] ORS;  Service: Ophthalmology;  Laterality: Right;  [place]   00:58.9 AP    24.1 CDE   14.19 casette lot#  . CHOLECYSTECTOMY    . GANGLION CYST EXCISION      There were no vitals filed for this visit.   Subjective Assessment - 12/02/17 1013    Subjective  Patient reports that she is feeling much better now. she has no new concers. She has not had a fall in the last several years. she mows her own lawn and lives alone.     Pertinent History  Jazlynn Rausch is a 84yo female who presented to [facility] 4/15 after sustaining a ground level fall at home.  She tested positive for BPPV and orthostatic hypotension so she will be referred for outpatient vestibular rehabilitation    Patient Stated Goals  No goals stated    Currently in Pain?  No/denies    Pain Score  0-No pain          [facility] PT Assessment - 12/02/17 1017      Assessment   Medical Diagnosis  TBI    Referring Provider  [name]    Onset Date/Surgical Date  11/10/17    Hand Dominance  Right      Precautions   Precautions  Fall      Restrictions   Weight Bearing Restrictions  No      Balance Screen   Has the patient fallen in the past 6 months  Yes    Has the patient had a decrease in activity level because of a fear of falling?   No    Is the patient reluctant to leave their home because of a fear of falling?   No      Home Environment   Living Environment  Private residence    Living Arrangements  Alone    Available Help at Discharge  Family    Type of Home  House    Home Access  Stairs to enter    Entrance Stairs-Number of Steps  2    Entrance Stairs-Rails  Right;Left    Home Layout  One level    Home Equipment  None      Prior Function  Level of Independence  Independent    Vocation  Retired    Leisure  sewing at times      [employer]   Overall Cognitive Status  Within Functional Limits for tasks assessed    Attention  Focused         POSTURE: WNL   PROM/AROM: WNL  STRENGTH:  Graded on a 0-5 scale Muscle Group Left Right                          Hip Flex 4/5 4/5  Hip Abd 4/5 4/5  Hip Add NT NT  Hip Ext NT NT  Hip IR/ER NT NT  Knee Flex 4/5 4/5  Knee Ext 4/5 4/5  Ankle DF 4/5 4/5  Ankle PF 4/5 4/5   SENSATION: WNL   FUNCTIONAL MOBILITY: independent with bed mobility and transfers sit to stand    BALANCE: BERG 50/56   GAIT: independent without AD household, intermediate distances   OUTCOME MEASURES: TEST Outcome Interpretation  5 times sit<>stand 9.58sec >82 yo, >15 sec indicates increased risk for falls  10 meter walk test  1.0               m/s <1.0 m/s indicates increased risk for falls; limited community ambulator  Timed up and Go   11.20              sec <82 sec indicates increased risk for falls      Berg Balance Assessment 50/56  <36/56 (82% risk for falls);, 37-45 (80% risk for falls); 46-51 (>50% risk for falls); 52-55 (lower risk <25% of falls)                 Objective measurements completed on examination: See above findings.              PT Education - 12/02/17 1019    Education provided  Yes    Education Details  plan of care    Person(s) Educated  Patient    Methods  Explanation    Comprehension  Verbalized understanding          PT Long Term Goals - 12/02/17 1049      PT LONG TERM GOAL #1   Title  plan of care will be reviewd at end of evaluation.     Time  1    Period  Days    Status  Achieved    Target Date  12/02/17             Plan - 12/02/17 1045    Clinical Impression Statement   Patient had a fall and was admitted to [facility]. -She tested positive for BPPV and orthostatic hypotension and  referred for outpatient vestibular rehabilitation . She has no dizziness and presents with no balance deficits, 50/56 Berg. She is independent with mobility and transfers and does not use an AD. She has 4/5 strength BLE and does not have decreased outcome measures that indicate a falls risk, and does not have indications or deficits that warrent PT at this time.     Clinical Presentation  Stable    Clinical Decision Making  Low    PT Frequency  One time visit    Consulted and Agree with Plan of Care  Patient       Patient will benefit from skilled therapeutic intervention in order to improve the following deficits and impairments:     Visit Diagnosis: Muscle weakness (generalized)  Problem List Patient Active Problem List   Diagnosis Date Noted  . Fall 11/10/2017    Ezekiel Ina, PT DPT 12/02/2017, 10:50 AM  [place] [facility] MAIN [facility] SERVICES [address] [place], [place], [address] Phone: [phone]   Fax:  [phone]  Name: Naia Ruff MRN: [id number] Date of Birth: October 29, 1924

## 2017-12-04 ENCOUNTER — Encounter: Payer: Medicare Other | Admitting: Occupational Therapy

## 2017-12-04 ENCOUNTER — Encounter: Payer: Medicare Other | Admitting: Physical Therapy

## 2017-12-04 NOTE — Therapy (Signed)
[place] [facility] MAIN [facility] SERVICES [address] [place], [place], [address] Phone: [phone]   Fax:  [phone]  Occupational Therapy Evaluation  Patient Details  Name: Amy Aguirre MRN: [id number] Date of Birth: Nov 23, 1924 Referring Provider: [name]   Encounter Date: 12/02/2017  OT End of Session - 12/04/17 2225    Visit Number  1    Number of Visits  1    Authorization Type  evaluation only    OT Start Time  0900    OT Stop Time  0940    OT Time Calculation (min)  40 min    Activity Tolerance  Patient tolerated treatment well    Behavior During Therapy  [facility] for tasks assessed/performed       Past Medical History:  Diagnosis Date  . Diabetes mellitus without complication (HCC)   . HOH (hard of hearing)   . Hypertension   . PONV (postoperative nausea and vomiting)     Past Surgical History:  Procedure Laterality Date  . ABDOMINAL HYSTERECTOMY    . CATARACT EXTRACTION W/PHACO Right 04/18/2015   Procedure: CATARACT EXTRACTION PHACO AND INTRAOCULAR LENS PLACEMENT (IOC);  Surgeon: Galen Manila, MD;  Location: [facility] ORS;  Service: Ophthalmology;  Laterality: Right;  [place]   00:58.9 AP    24.1 CDE   14.19 casette lot#  . CHOLECYSTECTOMY    . GANGLION CYST EXCISION      There were no vitals filed for this visit.  Subjective Assessment - 12/04/17 2223    Subjective   Patient reports she was trying to wash her windows getting ready for Easter, she fell backwards, unsure of why it happened.  Neighbor called her son.  She denies dizziness at the time.     Pertinent History  Patient fell at home on 11/10/2017 and was admitted to [facility] for 4 days.  She initially had dizziness which resolved in the inpatient setting with application of the Eppley maneuver.  She denies any current dizziness and feels she has returned to her baseline level.    Patient Stated Goals  Patient reports she would like to be independent as possible.    Currently in Pain?  No/denies    Pain Score  0-No pain        [facility] OT Assessment - 12/04/17 2223      Assessment   Medical Diagnosis  TBI    Referring Provider  [name]    Onset Date/Surgical Date  11/10/17    Hand Dominance  Right      Precautions   Precautions  Fall      Restrictions   Weight Bearing Restrictions  No      Balance Screen   Has the patient fallen in the past 6 months  Yes    How many times?  one time    Has the patient had a decrease in activity level because of a fear of falling?   No    Is the patient reluctant to leave their home because of a fear of falling?   No      Home  Environment   Family/patient expects to be discharged to:  Private residence    Living Arrangements  Alone    Available Help at Discharge  Family    Type of Home  House    Home Access  Stairs    Home Layout  One level    Alternate Level Stairs - Number of Steps  2 to enter  Bathroom Shower/Tub  Tub/Shower unit;Door    [employer]  Grab bars - tub/shower    Lives With  Alone      Prior Function   Level of Independence  Independent    Vocation  Retired    Leisure  sewing at times      ADL   Eating/Feeding  Independent    Grooming  Independent    Education officer, community    Lower Body Bathing  Modified independent    Upper Body Dressing  Independent    Lower Body Dressing  Modified independent    Toilet Transfer  Modified independent    Toileting -  Hygiene  Modified Independent    Tub/Shower Transfer  Other (comment) sponge baths    ADL comments  Patient reports she is still doing sponge baths and has not tried to get into the tub.  Prior to fall she always took a tub bath sitting.   Patient reports she feels back to baseline besides driving and doing yardwork.       IADL   Prior Level of Function Shopping  independent    Shopping  Needs to be accompanied on any shopping trip    Prior  Level of Function Light Housekeeping  independent    Light Housekeeping  Maintains house alone or with occasional assistance    Prior Level of Function Meal Prep  independent    Meal Prep  Able to complete simple warm meal prep    Prior Level of Function Community Mobility  independent    Community Mobility  Relies on family or friends for transportation    Prior Level of Function Medication Managment  independent    Medication Management  Is responsible for taking medication in correct dosages at correct time    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 independently (budgets, writes checks, pays rent, bills goes to bank), collects and keeps track of income      Mobility   Mobility Status  History of falls    Mobility Status Comments  ambulating without assistance or a device      Written Expression   Dominant Hand  Right      Vision - History   Baseline Vision  Wears glasses all the time    Additional Comments  denies any changes with vision      Cognition   Overall Cognitive Status  Within Functional Limits for tasks assessed    Memory  Appears intact    Awareness  Appears intact    Problem Solving  Appears intact    Cognition Comments  Patient reports her family feels her hearing has gotten worse.       Sensation   Light Touch  Appears Intact    Hot/Cold  Appears Intact    Proprioception  Appears Intact      Coordination   Gross Motor Movements are Fluid and Coordinated  Yes    Fine Motor Movements are Fluid and Coordinated  Yes    Finger Nose Finger Test  intact    9 Hole Peg Test  Right;Left    Right 9 Hole Peg Test  26    Left 9 Hole Peg Test  24      AROM   Overall AROM   Within functional limits for tasks performed    Overall AROM  Comments  Patient had surgery on right middle finger in the past.  Limited coordination in right middle finger present prior to fall.       Strength   Overall Strength   Within functional limits for tasks performed    Overall Strength Comments  Gout in the past, arthritic changes in bilateral hands.       Hand Function   Right Hand Grip (lbs)  22    Right Hand Lateral Pinch  9 lbs    Right Hand 3 Point Pinch  7 lbs    Left Hand Grip (lbs)  22    Left Hand Lateral Pinch  8 lbs    Left 3 point pinch  7 lbs                      OT Education - 12/04/17 2225    Education provided  Yes    Education Details  role of OT    Person(s) Educated  Patient    Methods  Explanation    Comprehension  Verbalized understanding          OT Long Term Goals - 12/02/17 0941      OT LONG TERM GOAL #1   Title  Patient appears back to baseline level for basic self care and IADL tasks at home and has no OT needs at this time.             Plan - 12/04/17 2226    Clinical Impression Statement  Patient is a 84 yo female who fell at home and suffered a TBI and was hospitalized.  She has now returned home and was seen this date for OT evaluation.  Patient appears back to baseline level of care, she is independent with all basic self care tasks, light homemaking and cooking.  She has not returned to driving yet and awaiting approval from MD.  She has not performed yardwork and will have family assistance at the moment but would like to get back to mowing her yard if possible. Patient does not require skilled OT services at this time, please reconsult if needs arise.     Occupational performance deficits (Please refer to evaluation for details):  IADL's    Rehab Potential  Good    Current Impairments/barriers affecting progress:  positive:  motivation, family support.  Negative:  age    OT Frequency  One time visit    Clinical Decision Making  Limited treatment options, no task modification necessary    Consulted and Agree with Plan of Care  Patient       Patient will benefit from skilled therapeutic intervention in order to improve the following  deficits and impairments:     Visit Diagnosis: Muscle weakness (generalized)    Problem List Patient Active Problem List   Diagnosis Date Noted  . Fall 11/10/2017   Amy Aguirre T Arne Cleveland, OTR/L, CLT  Amy Aguirre 12/04/2017, 10:36 PM  [place] [facility] MAIN [facility] SERVICES [address] [place], [place], [address] Phone: [phone]   Fax:  [phone]  Name: Amy Aguirre MRN: [id number] Date of Birth: [birth year]

## 2017-12-08 ENCOUNTER — Encounter: Payer: Medicare Other | Admitting: Occupational Therapy

## 2017-12-08 ENCOUNTER — Encounter: Payer: Medicare Other | Admitting: Physical Therapy

## 2017-12-10 ENCOUNTER — Encounter: Payer: Medicare Other | Admitting: Occupational Therapy

## 2017-12-10 ENCOUNTER — Encounter: Payer: Medicare Other | Admitting: Physical Therapy

## 2017-12-15 ENCOUNTER — Encounter: Payer: Medicare Other | Admitting: Physical Therapy

## 2017-12-15 ENCOUNTER — Encounter: Payer: Medicare Other | Admitting: Occupational Therapy

## 2017-12-18 ENCOUNTER — Encounter: Payer: Medicare Other | Admitting: Physical Therapy

## 2017-12-23 ENCOUNTER — Encounter: Payer: Medicare Other | Admitting: Occupational Therapy

## 2017-12-23 ENCOUNTER — Encounter: Payer: Medicare Other | Admitting: Physical Therapy

## 2017-12-25 ENCOUNTER — Encounter: Payer: Medicare Other | Admitting: Physical Therapy

## 2017-12-25 ENCOUNTER — Encounter: Payer: Medicare Other | Admitting: Occupational Therapy

## 2017-12-29 ENCOUNTER — Encounter: Payer: Medicare Other | Admitting: Occupational Therapy

## 2017-12-29 ENCOUNTER — Encounter: Payer: Medicare Other | Admitting: Physical Therapy

## 2017-12-31 ENCOUNTER — Encounter: Payer: Medicare Other | Admitting: Occupational Therapy

## 2017-12-31 ENCOUNTER — Encounter: Payer: Medicare Other | Admitting: Physical Therapy

## 2018-01-05 ENCOUNTER — Encounter: Payer: Medicare Other | Admitting: Occupational Therapy

## 2018-01-07 ENCOUNTER — Encounter: Payer: Medicare Other | Admitting: Occupational Therapy

## 2018-01-07 ENCOUNTER — Encounter: Payer: Medicare Other | Admitting: Physical Therapy

## 2018-01-12 ENCOUNTER — Encounter: Payer: Medicare Other | Admitting: Occupational Therapy

## 2018-01-12 ENCOUNTER — Encounter: Payer: Medicare Other | Admitting: Physical Therapy

## 2018-01-14 ENCOUNTER — Encounter: Payer: Medicare Other | Admitting: Occupational Therapy

## 2018-01-14 ENCOUNTER — Encounter: Payer: Medicare Other | Admitting: Physical Therapy

## 2018-01-16 ENCOUNTER — Emergency Department: Payer: Medicare Other

## 2018-01-16 ENCOUNTER — Inpatient Hospital Stay
Admission: EM | Admit: 2018-01-16 | Discharge: 2018-01-20 | DRG: 481 | Disposition: A | Discharge status: Home Health | Payer: Medicare Other | Attending: Internal Medicine | Admitting: Internal Medicine

## 2018-01-16 ENCOUNTER — Encounter: Payer: Self-pay | Admitting: Emergency Medicine

## 2018-01-16 ENCOUNTER — Other Ambulatory Visit: Payer: Self-pay

## 2018-01-16 DIAGNOSIS — S72144A Nondisplaced intertrochanteric fracture of right femur, initial encounter for closed fracture: Secondary | ICD-10-CM | POA: Diagnosis present

## 2018-01-16 DIAGNOSIS — D62 Acute posthemorrhagic anemia: Secondary | ICD-10-CM | POA: Diagnosis not present

## 2018-01-16 DIAGNOSIS — E785 Hyperlipidemia, unspecified: Secondary | ICD-10-CM | POA: Diagnosis present

## 2018-01-16 DIAGNOSIS — N179 Acute kidney failure, unspecified: Secondary | ICD-10-CM | POA: Diagnosis not present

## 2018-01-16 DIAGNOSIS — W03XXXA Other fall on same level due to collision with another person, initial encounter: Secondary | ICD-10-CM | POA: Diagnosis present

## 2018-01-16 DIAGNOSIS — Z7982 Long term (current) use of aspirin: Secondary | ICD-10-CM | POA: Diagnosis not present

## 2018-01-16 DIAGNOSIS — S72001A Fracture of unspecified part of neck of right femur, initial encounter for closed fracture: Secondary | ICD-10-CM

## 2018-01-16 DIAGNOSIS — N184 Chronic kidney disease, stage 4 (severe): Secondary | ICD-10-CM | POA: Diagnosis present

## 2018-01-16 DIAGNOSIS — I129 Hypertensive chronic kidney disease with stage 1 through stage 4 chronic kidney disease, or unspecified chronic kidney disease: Secondary | ICD-10-CM | POA: Diagnosis present

## 2018-01-16 DIAGNOSIS — Z8249 Family history of ischemic heart disease and other diseases of the circulatory system: Secondary | ICD-10-CM | POA: Diagnosis not present

## 2018-01-16 DIAGNOSIS — Z7984 Long term (current) use of oral hypoglycemic drugs: Secondary | ICD-10-CM | POA: Diagnosis not present

## 2018-01-16 DIAGNOSIS — S72009A Fracture of unspecified part of neck of unspecified femur, initial encounter for closed fracture: Secondary | ICD-10-CM | POA: Diagnosis present

## 2018-01-16 DIAGNOSIS — Y92009 Unspecified place in unspecified non-institutional (private) residence as the place of occurrence of the external cause: Secondary | ICD-10-CM

## 2018-01-16 DIAGNOSIS — Z681 Body mass index (BMI) 19 or less, adult: Secondary | ICD-10-CM | POA: Diagnosis not present

## 2018-01-16 DIAGNOSIS — R636 Underweight: Secondary | ICD-10-CM | POA: Diagnosis present

## 2018-01-16 DIAGNOSIS — E1122 Type 2 diabetes mellitus with diabetic chronic kidney disease: Secondary | ICD-10-CM | POA: Diagnosis present

## 2018-01-16 DIAGNOSIS — W19XXXA Unspecified fall, initial encounter: Secondary | ICD-10-CM

## 2018-01-16 LAB — BASIC METABOLIC PANEL
ANION GAP: 12 (ref 5–15)
BUN: 26 mg/dL — ABNORMAL HIGH (ref 6–20)
CHLORIDE: 104 mmol/L (ref 101–111)
CO2: 20 mmol/L — AB (ref 22–32)
Calcium: 9.1 mg/dL (ref 8.9–10.3)
Creatinine, Ser: 1.26 mg/dL — ABNORMAL HIGH (ref 0.44–1.00)
GFR calc non Af Amer: 36 mL/min — ABNORMAL LOW (ref >60)
GFR, EST AFRICAN AMERICAN: 42 mL/min — AB (ref >60)
Glucose, Bld: 222 mg/dL — ABNORMAL HIGH (ref 65–99)
POTASSIUM: 3.8 mmol/L (ref 3.5–5.1)
Sodium: 136 mmol/L (ref 135–145)

## 2018-01-16 LAB — CBC
HEMATOCRIT: 29.4 % — AB (ref 35.0–47.0)
Hemoglobin: 10 g/dL — ABNORMAL LOW (ref 12.0–16.0)
MCH: 34.6 pg — ABNORMAL HIGH (ref 26.0–34.0)
MCHC: 34.2 g/dL (ref 32.0–36.0)
MCV: 101.2 fL — ABNORMAL HIGH (ref 80.0–100.0)
Platelets: 230 10*3/uL (ref 150–440)
RBC: 2.9 MIL/uL — AB (ref 3.80–5.20)
RDW: 13.4 % (ref 11.5–14.5)
WBC: 7.3 10*3/uL (ref 3.6–11.0)

## 2018-01-16 MED ORDER — ACETAMINOPHEN 650 MG RE SUPP: 650.0000 mg | Freq: Four times a day (QID) | RECTAL | Status: DC | PRN

## 2018-01-16 MED ORDER — SIMVASTATIN 20 MG PO TABS
40.0000 mg | ORAL_TABLET | Freq: Every day | ORAL | Status: DC
  Administered 2018-01-17 – 2018-01-19 (×3): 40 mg via ORAL
  Filled 2018-01-16 (×3): qty 2

## 2018-01-16 MED ORDER — DIPHENHYDRAMINE-APAP (SLEEP) 25-500 MG PO TABS: 1.0000 | ORAL_TABLET | Freq: Every evening | ORAL | Status: DC | PRN

## 2018-01-16 MED ORDER — ALLOPURINOL 100 MG PO TABS
100.0000 mg | ORAL_TABLET | Freq: Every day | ORAL | Status: DC
  Administered 2018-01-18 – 2018-01-20 (×3): 100 mg via ORAL
  Filled 2018-01-16 (×4): qty 1

## 2018-01-16 MED ORDER — POTASSIUM CHLORIDE CRYS ER 10 MEQ PO TBCR
10.0000 meq | EXTENDED_RELEASE_TABLET | Freq: Every day | ORAL | Status: DC
  Administered 2018-01-18 – 2018-01-20 (×3): 10 meq via ORAL
  Filled 2018-01-16 (×3): qty 1

## 2018-01-16 MED ORDER — CEFAZOLIN SODIUM-DEXTROSE 1-4 GM/50ML-% IV SOLN
1.0000 g | INTRAVENOUS | Status: DC
  Filled 2018-01-16: qty 50

## 2018-01-16 MED ORDER — IRBESARTAN 150 MG PO TABS
150.0000 mg | ORAL_TABLET | Freq: Every day | ORAL | Status: DC
  Administered 2018-01-20: 150 mg via ORAL
  Filled 2018-01-16 (×3): qty 1

## 2018-01-16 MED ORDER — FERROUS FUMARATE 324 (106 FE) MG PO TABS
ORAL_TABLET | Freq: Every day | ORAL | Status: DC
  Administered 2018-01-17 – 2018-01-19 (×4): 106 mg via ORAL
  Filled 2018-01-16 (×5): qty 1

## 2018-01-16 MED ORDER — SODIUM CHLORIDE 0.9 % IV SOLN
INTRAVENOUS | Status: DC
  Administered 2018-01-16: 23:00:00 via INTRAVENOUS

## 2018-01-16 MED ORDER — ACETAMINOPHEN 325 MG PO TABS: 650.0000 mg | ORAL_TABLET | Freq: Four times a day (QID) | ORAL | Status: DC | PRN

## 2018-01-16 MED ORDER — TRAZODONE HCL 50 MG PO TABS: 25.0000 mg | ORAL_TABLET | Freq: Every evening | ORAL | Status: DC | PRN

## 2018-01-16 MED ORDER — ONDANSETRON HCL 4 MG/2ML IJ SOLN
4.0000 mg | Freq: Four times a day (QID) | INTRAMUSCULAR | Status: DC | PRN
  Administered 2018-01-17: 4 mg via INTRAVENOUS
  Filled 2018-01-16: qty 2

## 2018-01-16 MED ORDER — DOCUSATE SODIUM 100 MG PO CAPS
100.0000 mg | ORAL_CAPSULE | Freq: Two times a day (BID) | ORAL | Status: DC
  Administered 2018-01-16: 100 mg via ORAL
  Filled 2018-01-16: qty 1

## 2018-01-16 MED ORDER — AMLODIPINE BESYLATE 5 MG PO TABS
5.0000 mg | ORAL_TABLET | Freq: Once | ORAL | Status: AC
  Administered 2018-01-16: 5 mg via ORAL
  Filled 2018-01-16: qty 1

## 2018-01-16 MED ORDER — CALCIUM CARBONATE-VITAMIN D 500-200 MG-UNIT PO TABS
ORAL_TABLET | Freq: Every day | ORAL | Status: DC
  Administered 2018-01-16 – 2018-01-19 (×4): 1 via ORAL
  Filled 2018-01-16 (×4): qty 1

## 2018-01-16 MED ORDER — ONDANSETRON HCL 4 MG PO TABS: 4.0000 mg | ORAL_TABLET | Freq: Four times a day (QID) | ORAL | Status: DC | PRN

## 2018-01-16 MED ORDER — BISACODYL 5 MG PO TBEC: 5.0000 mg | DELAYED_RELEASE_TABLET | Freq: Every day | ORAL | Status: DC | PRN

## 2018-01-16 MED ORDER — MIRTAZAPINE 15 MG PO TABS
15.0000 mg | ORAL_TABLET | Freq: Every day | ORAL | Status: DC
  Administered 2018-01-17 – 2018-01-19 (×4): 15 mg via ORAL
  Filled 2018-01-16 (×4): qty 1

## 2018-01-16 MED ORDER — CEFAZOLIN (ANCEF) 1 G IV SOLR: 1.0000 g | INTRAVENOUS | Status: DC

## 2018-01-16 MED ORDER — ADULT MULTIVITAMIN W/MINERALS CH
1.0000 | ORAL_TABLET | Freq: Every day | ORAL | Status: DC
  Administered 2018-01-18 – 2018-01-20 (×3): 1 via ORAL
  Filled 2018-01-16 (×3): qty 1

## 2018-01-16 MED ORDER — ATENOLOL 25 MG PO TABS
100.0000 mg | ORAL_TABLET | Freq: Every day | ORAL | Status: DC
  Administered 2018-01-17 – 2018-01-20 (×4): 100 mg via ORAL
  Filled 2018-01-16 (×4): qty 4

## 2018-01-16 MED ORDER — HYDROCODONE-ACETAMINOPHEN 5-325 MG PO TABS
1.0000 | ORAL_TABLET | ORAL | Status: DC | PRN
  Administered 2018-01-16: 1 via ORAL
  Administered 2018-01-17 – 2018-01-19 (×4): 2 via ORAL
  Filled 2018-01-16 (×4): qty 2

## 2018-01-16 MED ORDER — DIPHENHYDRAMINE HCL 25 MG PO CAPS: 25.0000 mg | ORAL_CAPSULE | Freq: Every evening | ORAL | Status: DC | PRN

## 2018-01-16 MED ORDER — INSULIN ASPART 100 UNIT/ML ~~LOC~~ SOLN
0.0000 [IU] | Freq: Three times a day (TID) | SUBCUTANEOUS | Status: DC
  Administered 2018-01-17 – 2018-01-18 (×3): 1 [IU] via SUBCUTANEOUS
  Administered 2018-01-18: 2 [IU] via SUBCUTANEOUS
  Administered 2018-01-19: 1 [IU] via SUBCUTANEOUS
  Administered 2018-01-19 (×2): 2 [IU] via SUBCUTANEOUS
  Filled 2018-01-16 (×7): qty 1

## 2018-01-16 MED ORDER — INSULIN ASPART 100 UNIT/ML ~~LOC~~ SOLN
0.0000 [IU] | Freq: Every day | SUBCUTANEOUS | Status: DC
Start: 2018-01-16 — End: 2018-01-20
  Filled 2018-01-16: qty 1

## 2018-01-16 NOTE — H&P (Signed)
[facility] -  at [facility]   PATIENT NAME: Amy Aguirre    MR#:  [id number]  DATE OF BIRTH:  01/15/1925  DATE OF ADMISSION:  01/16/2018  PRIMARY CARE PHYSICIAN: Jaclyn Shaggyate, Denny C, MD   REQUESTING/REFERRING PHYSICIAN:   CHIEF COMPLAINT:   Chief Complaint  Patient presents with  . Fall    HISTORY OF PRESENT ILLNESS: Amy NovakDorene Aguirre  is a 84 y.o. y.o. female with a known history of diabetes type 2 and hypertension. Patient was brought to emergency room status post mechanical fall.  Patient states she fell backwards hitting the right side of her head on a folding chair and landing on her right hip.  She immediately complained of severe pain at the right hip and she has not been able to ambulate since the fall.  Patient denies having any chest pain, dizziness, loss of consciousness prior or during the episode. She is otherwise very independent, lives alone with family close by.  She is still driving and going to the stores, walking without any device.  No history of heart disease, no chest pain/shortness of breath with exertion. Blood test done emergency room are remarkable for hemoglobin level at 10, creatinine level of 1.26 and glucose level at 222.  UA pending. EKG, reviewed by myself, shows normal sinus rhythm with a heart rate of 63 bpm; normal axis; normal intervals; no ST-T changes. X-ray of the pelvis shows nondisplaced intertrochanteric fracture of the proximal right femur. No dislocation. Chest x-ray and brain CT scan are negative for any acute abnormality. Patient is admitted for surgical repair of her right hip fracture.   PAST MEDICAL HISTORY:   Past Medical History:  Diagnosis Date  . Diabetes mellitus without complication (HCC)   . HOH (hard of hearing)   . Hypertension   . PONV (postoperative nausea and vomiting)     PAST SURGICAL HISTORY:  Past Surgical History:  Procedure Laterality Date  . ABDOMINAL HYSTERECTOMY    . CATARACT EXTRACTION  W/PHACO Right 04/18/2015   Procedure: CATARACT EXTRACTION PHACO AND INTRAOCULAR LENS PLACEMENT (IOC);  Surgeon: Galen ManilaWilliam Porfilio, MD;  Location: [facility] ORS;  Service: Ophthalmology;  Laterality: Right;  US   00:58.9 AP    24.1 CDE   14.19 casette lot#  . CHOLECYSTECTOMY    . GANGLION CYST EXCISION      SOCIAL HISTORY:  Social History   Tobacco Use  . Smoking status: Never Smoker  . Smokeless tobacco: Never Used  Substance Use Topics  . Alcohol use: No    FAMILY HISTORY: Hypertension in both parents.  DRUG ALLERGIES: No Known Allergies  REVIEW OF SYSTEMS:   CONSTITUTIONAL: No fever, fatigue or weakness.  EYES: No blurred or double vision.  EARS, NOSE, AND THROAT: No tinnitus or ear pain.  RESPIRATORY: No cough, shortness of breath, wheezing or hemoptysis.  CARDIOVASCULAR: No chest pain, orthopnea, edema.  GASTROINTESTINAL: No nausea, vomiting, diarrhea or abdominal pain.  GENITOURINARY: No dysuria, hematuria.  ENDOCRINE: No polyuria, nocturia,  HEMATOLOGY: No anemia, easy bruising or bleeding SKIN: No rash or lesion. MUSCULOSKELETAL: Positive for right hip pain.   NEUROLOGIC: No tingling, numbness, weakness.  PSYCHIATRY: No anxiety or depression.   MEDICATIONS AT HOME:  Prior to Admission medications   Medication Sig Start Date End Date Taking? Authorizing Provider  aspirin EC 81 MG tablet Take 81 mg by mouth at bedtime.   Yes [provider]  atenolol (TENORMIN) 100 MG tablet Take 100 mg by mouth daily.  [provider]  Calcium Carb-Cholecalciferol (CALCIUM+D3 PO) Take 1 tablet by mouth at bedtime. Oscal    [provider]  diphenhydramine-acetaminophen (TYLENOL PM) 25-500 MG TABS tablet Take 1 tablet by mouth at bedtime as needed (sleep).    [provider]  glimepiride (AMARYL) 2 MG tablet Take 1 tablet (2 mg total) by mouth every Monday, Wednesday, and Friday. Patient taking differently: Take 1 mg by mouth every Monday, Wednesday,  and Friday.  11/14/17   Meuth, Brooke A, PA-C  indapamide (LOZOL) 2.5 MG tablet Take 2.5 mg by mouth daily.    [provider]  indomethacin (INDOCIN) 25 MG capsule Take 25 mg by mouth daily as needed (gout).     [provider]  irbesartan (AVAPRO) 150 MG tablet Take 150 mg by mouth daily. 10/17/17   [provider]  IRON PO Take 1 tablet by mouth at bedtime.    [provider]  metFORMIN (GLUCOPHAGE) 500 MG tablet Take 500 mg by mouth 2 (two) times daily with a meal.     [provider]  mirtazapine (REMERON) 15 MG tablet Take 15 mg by mouth at bedtime. 09/16/17   [provider]  Multiple Vitamin (MULTIVITAMIN WITH MINERALS) TABS tablet Take 1 tablet by mouth daily. Centrum Silver    [provider]  potassium chloride (K-DUR,KLOR-CON) 10 MEQ tablet Take 10 mEq by mouth daily.    [provider]  simvastatin (ZOCOR) 40 MG tablet Take 40 mg by mouth daily.    [provider]      PHYSICAL EXAMINATION:   VITAL SIGNS: Blood pressure (!) 175/77, pulse 63, temperature 97.7 F (36.5 C), temperature source Oral, resp. rate 16, height 4\' 11"  (1.499 m), weight 42.6 kg (94 lb), SpO2 98 %.  GENERAL:  84 y.o.-year-old patient lying in the bed with minimal distress, secondary to right hip pain, worse with movement.  EYES: Pupils equal, round, reactive to light and accommodation. No scleral icterus. Extraocular muscles intact.  HEENT: Head atraumatic, normocephalic. Oropharynx and nasopharynx clear.  NECK:  Supple, no jugular venous distention. No thyroid enlargement, no tenderness.  LUNGS: Normal breath sounds bilaterally, no wheezing, rales,rhonchi or crepitation. No use of accessory muscles of respiration.  CARDIOVASCULAR: S1, S2 normal. No S3/S4.  ABDOMEN: Soft, nontender, nondistended. Bowel sounds present. No organomegaly or mass.  EXTREMITIES: No pedal edema, cyanosis, or clubbing.  NEUROLOGIC: No focal  weakness. PSYCHIATRIC: The patient is alert and oriented x 3.  SKIN: No obvious rash, lesion, or ulcer.   LABORATORY PANEL:   CBC Recent Labs  Lab 01/16/18 2059  WBC 7.3  HGB 10.0*  HCT 29.4*  PLT 230  MCV 101.2*  MCH 34.6*  MCHC 34.2  RDW 13.4   ------------------------------------------------------------------------------------------------------------------  Chemistries  No results for input(s): NA, K, CL, CO2, GLUCOSE, BUN, CREATININE, CALCIUM, MG, AST, ALT, ALKPHOS, BILITOT in the last 168 hours.  Invalid input(s): GFRCGP ------------------------------------------------------------------------------------------------------------------ CrCl cannot be calculated (Patient's most recent lab result is older than the maximum 21 days allowed.). ------------------------------------------------------------------------------------------------------------------ No results for input(s): TSH, T4TOTAL, T3FREE, THYROIDAB in the last 72 hours.  Invalid input(s): FREET3   Coagulation profile No results for input(s): INR, PROTIME in the last 168 hours. ------------------------------------------------------------------------------------------------------------------- No results for input(s): DDIMER in the last 72 hours. -------------------------------------------------------------------------------------------------------------------  Cardiac Enzymes No results for input(s): CKMB, TROPONINI, MYOGLOBIN in the last 168 hours.  Invalid input(s): CK ------------------------------------------------------------------------------------------------------------------ Invalid input(s): POCBNP  ---------------------------------------------------------------------------------------------------------------  Urinalysis No results found for: COLORURINE, APPEARANCEUR, LABSPEC, PHURINE, GLUCOSEU,  HGBUR, BILIRUBINUR, KETONESUR, PROTEINUR, UROBILINOGEN, NITRITE, LEUKOCYTESUR   RADIOLOGY: Ct Head  Wo Contrast  Result Date: 01/16/2018 CLINICAL DATA:  Fall and hit head hematoma to the right temple EXAM: CT HEAD WITHOUT CONTRAST TECHNIQUE: Contiguous axial images were obtained from the base of the skull through the vertex without intravenous contrast. COMPARISON:  11/11/2017 FINDINGS: Brain: No acute territorial infarction, hemorrhage or intracranial mass is visualized. Moderate atrophy. Mild small vessel ischemic changes of the white matter. Stable ventricle size. Vascular: No hyperdense vessels.  Carotid vascular calcification Skull: Old left sub occipital and occipital bone fracture. No definite acute fracture is seen Sinuses/Orbits: No acute finding. Other: None. IMPRESSION: 1. No CT evidence for acute intracranial abnormality. 2. Atrophy and mild small vessel ischemic changes of the white matter 3. Old left occipital bone fracture Electronically Signed   By: Jasmine Pang M.D.   On: 01/16/2018 20:12   Dg Chest Portable 1 View  Result Date: 01/16/2018 CLINICAL DATA:  Larey Seat at home.  Hip fracture. EXAM: PORTABLE CHEST 1 VIEW COMPARISON:  None. FINDINGS: Cardiac silhouette is normal in size. No mediastinal or hilar masses. No evidence of adenopathy. Clear lungs.  No pleural effusion or pneumothorax. Skeletal structures are grossly intact. IMPRESSION: No active disease. Electronically Signed   By: Amie Portland M.D.   On: 01/16/2018 20:33   Dg Hip Unilat  With Pelvis 2-3 Views Right  Result Date: 01/16/2018 CLINICAL DATA:  Larey Seat at home.  Right hip pain. EXAM: DG HIP (WITH OR WITHOUT PELVIS) 2-3V RIGHT COMPARISON:  None. FINDINGS: There is a nondisplaced, nonangulated intertrochanteric fracture of the proximal right femur. There are separate nondisplaced fracture components from the superior greater trochanter and lesser trochanter. No other fractures. Hip joints are normally spaced and aligned as are the SI joints and symphysis pubis. Bones are demineralized. IMPRESSION: 1. Nondisplaced  intertrochanteric fracture of the proximal right femur. No dislocation. Electronically Signed   By: Amie Portland M.D.   On: 01/16/2018 20:32    EKG: Orders placed or performed during the hospital encounter of 01/16/18  . EKG 12-Lead  . EKG 12-Lead    IMPRESSION AND PLAN:  1.  Right hip fracture, status post mechanical fall.  Patient is scheduled for surgical repair in a.m. We will keep patient n.p.o. and avoid any anticoagulants.  Continue pain control. Based on patient's physical capacity, medical problems and EKG report she is medically clear for the above-mentioned orthopedic procedure.  Patient is at low risk for postop cardiopulmonary complications.   2. ARF, likely prerenal, secondary to poor p.o. intake and diuretic use.  Will discontinue indapamide.  We will start gentle IV hydration and monitor kidney function closely.  Avoid nephrotoxic medications, including NSAIDs. 3.  Diabetes type 2.  Will monitor blood sugars before meals and at bedtime and use insulin during the hospital stay. 4.  Hypertension.  BP is elevated at this time.  We will add Norvasc, to replace indapamide.  Continue to monitor BP closely and adjust medication, as needed.  All the records are reviewed and case discussed with ED provider. Management plans discussed with the patient, family and they are in agreement.  CODE STATUS: Code Status History    Date Active Date Inactive Code Status Order ID Comments User Context   11/10/2017 2319 11/13/2017 1842 Full Code [id number]  Almond Lint, MD Inpatient       TOTAL TIME TAKING CARE OF THIS PATIENT: 45 minutes.    Cammy Copa M.D on 01/16/2018 at 9:26 PM  Between 7am to 6pm - Pager - [phone]  After 6pm go to [email or web address] - password [email or web address] [facility]  [facility]  Office  [phone]  CC: Primary care physician; Jaclyn Shaggy, MD

## 2018-01-16 NOTE — ED Provider Notes (Signed)
[facility] Emergency Department Provider Note  Time seen: 8:39 PM  I have reviewed the triage vital signs and the nursing notes.   HISTORY  Chief Complaint Fall    HPI Amy Aguirre is a 84 y.o. female with a past medical history of diabetes, hypertension, presents to the emergency department after a fall.  According to the patient her great granddaughter who is 82 years old ran up to hug her causing the patient to fall backwards hitting the right side of her head on a folding chair and landing on her right hip.  Patient states moderate pain in the right hip especially with movement, denies any headache neck or back pain.  Patient denies loss of consciousness.  Does not take blood thinners.  Patient is very independent, lives alone with family close by.  Does not use a walker or cane to ambulate.   Past Medical History:  Diagnosis Date  . Diabetes mellitus without complication (HCC)   . HOH (hard of hearing)   . Hypertension   . PONV (postoperative nausea and vomiting)     Patient Active Problem List   Diagnosis Date Noted  . Fall 11/10/2017    Past Surgical History:  Procedure Laterality Date  . ABDOMINAL HYSTERECTOMY    . CATARACT EXTRACTION W/PHACO Right 04/18/2015   Procedure: CATARACT EXTRACTION PHACO AND INTRAOCULAR LENS PLACEMENT (IOC);  Surgeon: Galen ManilaWilliam Porfilio, MD;  Location: [facility] ORS;  Service: Ophthalmology;  Laterality: Right;  US   00:58.9 AP    24.1 CDE   14.19 casette lot#  . CHOLECYSTECTOMY    . GANGLION CYST EXCISION      Prior to Admission medications   Medication Sig Start Date End Date Taking? Authorizing Provider  aspirin EC 81 MG tablet Take 81 mg by mouth at bedtime.    [provider]  atenolol (TENORMIN) 100 MG tablet Take 100 mg by mouth daily.    [provider]  Calcium Carb-Cholecalciferol (CALCIUM+D3 PO) Take 1 tablet by mouth at bedtime. Oscal    [provider]   diphenhydramine-acetaminophen (TYLENOL PM) 25-500 MG TABS tablet Take 1 tablet by mouth at bedtime as needed (sleep).    [provider]  glimepiride (AMARYL) 2 MG tablet Take 1 tablet (2 mg total) by mouth every Monday, Wednesday, and Friday. Patient taking differently: Take 1 mg by mouth every Monday, Wednesday, and Friday.  11/14/17   Meuth, Brooke A, PA-C  indapamide (LOZOL) 2.5 MG tablet Take 2.5 mg by mouth daily.    [provider]  indomethacin (INDOCIN) 25 MG capsule Take 25 mg by mouth daily as needed (gout).     [provider]  irbesartan (AVAPRO) 150 MG tablet Take 150 mg by mouth daily. 10/17/17   [provider]  IRON PO Take 1 tablet by mouth at bedtime.    [provider]  metFORMIN (GLUCOPHAGE) 500 MG tablet Take 500 mg by mouth 2 (two) times daily with a meal.     [provider]  mirtazapine (REMERON) 15 MG tablet Take 15 mg by mouth at bedtime. 09/16/17   [provider]  Multiple Vitamin (MULTIVITAMIN WITH MINERALS) TABS tablet Take 1 tablet by mouth daily. Centrum Silver    [provider]  potassium chloride (K-DUR,KLOR-CON) 10 MEQ tablet Take 10 mEq by mouth daily.    [provider]  simvastatin (ZOCOR) 40 MG tablet Take 40 mg by mouth daily.    [provider]    No  Known Allergies  No family history on file.  Social History Social History   Tobacco Use  . Smoking status: Never Smoker  . Smokeless tobacco: Never Used  Substance Use Topics  . Alcohol use: No  . Drug use: Not on file    Review of Systems Constitutional: Negative for fever.  Negative for LOC. Eyes: Negative for visual complaints Cardiovascular: Negative for chest pain. Respiratory: Negative for shortness of breath. Gastrointestinal: Negative for abdominal pain, vomiting Musculoskeletal: Right hip pain. Skin: Negative for skin complaints  Neurological: Negative for headache All other ROS  negative  ____________________________________________   PHYSICAL EXAM:  VITAL SIGNS: ED Triage Vitals  Enc Vitals Group     BP 01/16/18 1936 (!) 167/73     Pulse Rate 01/16/18 1936 67     Resp 01/16/18 1936 18     Temp 01/16/18 1936 97.7 F (36.5 C)     Temp Source 01/16/18 1936 Oral     SpO2 01/16/18 1936 100 %     Weight 01/16/18 1937 94 lb (42.6 kg)     Height 01/16/18 1937 4\' 11"  (1.499 m)     Head Circumference --      Peak Flow --      Pain Score 01/16/18 1937 10     Pain Loc --      Pain Edu? --      Excl. in GC? --    Constitutional: Alert and oriented. Well appearing and in no distress. Eyes: Normal exam ENT   Head: Small hematoma to right temple.   Mouth/Throat: Mucous membranes are moist. Cardiovascular: Normal rate, regular rhythm. Respiratory: Normal respiratory effort without tachypnea nor retractions. Breath sounds are clear  Gastrointestinal: Soft and nontender. No distention.   Musculoskeletal: Moderate sized hematoma to right hip, moderate pain with attempted range of motion of right hip, neurovascular intact distally. Neurologic:  Normal speech and language. No gross focal neurologic deficits Skin:  Skin is warm, dry and intact.  Psychiatric: Mood and affect are normal.   ____________________________________________    EKG  EKG reviewed and interpreted by myself shows normal sinus rhythm at 63 bpm with a narrow QRS, normal axis, normal intervals, no concerning ST changes.  ____________________________________________    RADIOLOGY  CT negative. Chest x-ray negative. X-ray shows nondisplaced intertrochanteric fracture of the right femur.  ____________________________________________   INITIAL IMPRESSION / ASSESSMENT AND PLAN / ED COURSE  Pertinent labs & imaging results that were available during my care of the patient were reviewed by me and considered in my medical decision making (see chart for details).  Patient presents  emergency department after a fall with right hip pain.  Differential would include fracture, dislocation, hematoma, contusion.  Patient has a small hematoma to the right head, CT scans negative for acute abnormality.  Chest x-ray negative.  X-ray of the hip shows intertrochanteric fracture.  We will check blood work and discuss with orthopedics, will discuss with medicine for admission.  ____________________________________________   FINAL CLINICAL IMPRESSION(S) / ED DIAGNOSES  Right hip fracture    Minna Antis, MD 01/16/18 2042

## 2018-01-16 NOTE — ED Notes (Signed)
Patient transported to CT 

## 2018-01-16 NOTE — ED Notes (Addendum)
MD at bedside. 

## 2018-01-16 NOTE — ED Triage Notes (Signed)
Patient was at home and fell over a grandchild and hit her head and she takes a baby aspirin daily.  Pt is AOx4 and has small quarter sized hematoma on right temple.  Pt is co right hip pain and is able to extend against resistance on her foot.  She reports no sensation loss in her feet.

## 2018-01-17 ENCOUNTER — Inpatient Hospital Stay: Payer: Medicare Other | Admitting: Anesthesiology

## 2018-01-17 ENCOUNTER — Inpatient Hospital Stay: Payer: Medicare Other

## 2018-01-17 ENCOUNTER — Encounter: Payer: Self-pay | Admitting: Anesthesiology

## 2018-01-17 ENCOUNTER — Encounter
Admission: EM | Disposition: A | Discharge status: Home Health | Payer: Self-pay | Source: Home / Self Care | Attending: Internal Medicine

## 2018-01-17 HISTORY — PX: INTRAMEDULLARY (IM) NAIL INTERTROCHANTERIC: SHX5875

## 2018-01-17 LAB — FERRITIN: Ferritin: 89 ng/mL (ref 11–307)

## 2018-01-17 LAB — URINALYSIS, COMPLETE (UACMP) WITH MICROSCOPIC
BILIRUBIN URINE: NEGATIVE
Glucose, UA: NEGATIVE mg/dL
HGB URINE DIPSTICK: NEGATIVE
KETONES UR: NEGATIVE mg/dL
NITRITE: NEGATIVE
PROTEIN: NEGATIVE mg/dL
Specific Gravity, Urine: 1.013 (ref 1.005–1.030)
pH: 5 (ref 5.0–8.0)

## 2018-01-17 LAB — GLUCOSE, CAPILLARY
GLUCOSE-CAPILLARY: 130 mg/dL — AB (ref 65–99)
Glucose-Capillary: 118 mg/dL — ABNORMAL HIGH (ref 65–99)
Glucose-Capillary: 140 mg/dL — ABNORMAL HIGH (ref 65–99)
Glucose-Capillary: 140 mg/dL — ABNORMAL HIGH (ref 65–99)
Glucose-Capillary: 141 mg/dL — ABNORMAL HIGH (ref 65–99)
Glucose-Capillary: 143 mg/dL — ABNORMAL HIGH (ref 65–99)
Glucose-Capillary: 211 mg/dL — ABNORMAL HIGH (ref 65–99)

## 2018-01-17 LAB — CBC
HEMATOCRIT: 26.2 % — AB (ref 35.0–47.0)
HEMOGLOBIN: 9.1 g/dL — AB (ref 12.0–16.0)
MCH: 35.1 pg — AB (ref 26.0–34.0)
MCHC: 34.9 g/dL (ref 32.0–36.0)
MCV: 100.6 fL — ABNORMAL HIGH (ref 80.0–100.0)
Platelets: 209 10*3/uL (ref 150–440)
RBC: 2.61 MIL/uL — ABNORMAL LOW (ref 3.80–5.20)
RDW: 13.5 % (ref 11.5–14.5)
WBC: 8.9 10*3/uL (ref 3.6–11.0)

## 2018-01-17 LAB — BASIC METABOLIC PANEL
ANION GAP: 8 (ref 5–15)
BUN: 22 mg/dL — AB (ref 6–20)
CALCIUM: 9.5 mg/dL (ref 8.9–10.3)
CO2: 24 mmol/L (ref 22–32)
Chloride: 106 mmol/L (ref 101–111)
Creatinine, Ser: 1.05 mg/dL — ABNORMAL HIGH (ref 0.44–1.00)
GFR calc Af Amer: 53 mL/min — ABNORMAL LOW (ref >60)
GFR calc non Af Amer: 45 mL/min — ABNORMAL LOW (ref >60)
GLUCOSE: 124 mg/dL — AB (ref 65–99)
Potassium: 4.2 mmol/L (ref 3.5–5.1)
Sodium: 138 mmol/L (ref 135–145)

## 2018-01-17 LAB — PROTIME-INR
INR: 1.19
PROTHROMBIN TIME: 15 s (ref 11.4–15.2)

## 2018-01-17 LAB — MRSA PCR SCREENING: MRSA BY PCR: NEGATIVE

## 2018-01-17 SURGERY — FIXATION, FRACTURE, INTERTROCHANTERIC, WITH INTRAMEDULLARY ROD
Anesthesia: Spinal | Laterality: Right

## 2018-01-17 MED ORDER — ONDANSETRON HCL 4 MG/2ML IJ SOLN
INTRAMUSCULAR | Status: DC | PRN
  Administered 2018-01-17: 4 mg via INTRAVENOUS

## 2018-01-17 MED ORDER — PHENOL 1.4 % MT LIQD
1.0000 | OROMUCOSAL | Status: DC | PRN
  Filled 2018-01-17: qty 177

## 2018-01-17 MED ORDER — METHOCARBAMOL 500 MG PO TABS: 500.0000 mg | ORAL_TABLET | Freq: Four times a day (QID) | ORAL | Status: DC | PRN

## 2018-01-17 MED ORDER — METOCLOPRAMIDE HCL 10 MG PO TABS: 5.0000 mg | ORAL_TABLET | Freq: Three times a day (TID) | ORAL | Status: DC | PRN

## 2018-01-17 MED ORDER — DEXTROSE 5 % IV SOLN
500.0000 mg | Freq: Four times a day (QID) | INTRAVENOUS | Status: DC | PRN
  Filled 2018-01-17: qty 5

## 2018-01-17 MED ORDER — BUPIVACAINE HCL (PF) 0.5 % IJ SOLN
INTRAMUSCULAR | Status: DC | PRN
  Administered 2018-01-17: 2 mL

## 2018-01-17 MED ORDER — MAGNESIUM CITRATE PO SOLN
1.0000 | Freq: Once | ORAL | Status: DC | PRN
  Filled 2018-01-17: qty 296

## 2018-01-17 MED ORDER — MORPHINE SULFATE (PF) 2 MG/ML IV SOLN: 0.5000 mg | INTRAVENOUS | Status: DC | PRN

## 2018-01-17 MED ORDER — SODIUM CHLORIDE 0.9 % IV SOLN
INTRAVENOUS | Status: DC
  Administered 2018-01-17 – 2018-01-18 (×2): via INTRAVENOUS

## 2018-01-17 MED ORDER — CEFAZOLIN SODIUM-DEXTROSE 1-4 GM/50ML-% IV SOLN
1.0000 g | Freq: Four times a day (QID) | INTRAVENOUS | Status: AC
  Administered 2018-01-17 – 2018-01-18 (×3): 1 g via INTRAVENOUS
  Filled 2018-01-17 (×3): qty 50

## 2018-01-17 MED ORDER — MIDAZOLAM HCL 5 MG/5ML IJ SOLN
INTRAMUSCULAR | Status: DC | PRN
  Administered 2018-01-17 (×2): 1 mg via INTRAVENOUS

## 2018-01-17 MED ORDER — MENTHOL 3 MG MT LOZG
1.0000 | LOZENGE | OROMUCOSAL | Status: DC | PRN
  Filled 2018-01-17: qty 9

## 2018-01-17 MED ORDER — HYDROCODONE-ACETAMINOPHEN 5-325 MG PO TABS
1.0000 | ORAL_TABLET | ORAL | Status: DC | PRN
  Filled 2018-01-17: qty 2

## 2018-01-17 MED ORDER — DOCUSATE SODIUM 100 MG PO CAPS
100.0000 mg | ORAL_CAPSULE | Freq: Two times a day (BID) | ORAL | Status: DC
  Administered 2018-01-17 – 2018-01-20 (×7): 100 mg via ORAL
  Filled 2018-01-17 (×7): qty 1

## 2018-01-17 MED ORDER — HYDROCODONE-ACETAMINOPHEN 7.5-325 MG PO TABS: 1.0000 | ORAL_TABLET | ORAL | Status: DC | PRN

## 2018-01-17 MED ORDER — PROPOFOL 500 MG/50ML IV EMUL
INTRAVENOUS | Status: DC | PRN
  Administered 2018-01-17: 40 ug/kg/min via INTRAVENOUS

## 2018-01-17 MED ORDER — ACETAMINOPHEN 325 MG PO TABS: 325.0000 mg | ORAL_TABLET | Freq: Four times a day (QID) | ORAL | Status: DC | PRN

## 2018-01-17 MED ORDER — PHENYLEPHRINE HCL 10 MG/ML IJ SOLN
INTRAMUSCULAR | Status: DC | PRN
  Administered 2018-01-17 (×2): 200 ug via INTRAVENOUS

## 2018-01-17 MED ORDER — KETAMINE HCL 10 MG/ML IJ SOLN
INTRAMUSCULAR | Status: DC | PRN
  Administered 2018-01-17: 40 mg via INTRAVENOUS

## 2018-01-17 MED ORDER — ZOLPIDEM TARTRATE 5 MG PO TABS
5.0000 mg | ORAL_TABLET | Freq: Every evening | ORAL | Status: DC | PRN
Start: 2018-01-17 — End: 2018-01-20

## 2018-01-17 MED ORDER — OXYCODONE HCL 5 MG/5ML PO SOLN: 5.0000 mg | Freq: Once | ORAL | Status: DC | PRN

## 2018-01-17 MED ORDER — PROPOFOL 10 MG/ML IV BOLUS
INTRAVENOUS | Status: DC | PRN
  Administered 2018-01-17: 20 mg via INTRAVENOUS

## 2018-01-17 MED ORDER — PROPOFOL 500 MG/50ML IV EMUL
INTRAVENOUS | Status: AC
  Filled 2018-01-17: qty 50

## 2018-01-17 MED ORDER — LACTATED RINGERS IV SOLN
INTRAVENOUS | Status: DC | PRN
  Administered 2018-01-17: 12:00:00 via INTRAVENOUS

## 2018-01-17 MED ORDER — ONDANSETRON HCL 4 MG/2ML IJ SOLN
INTRAMUSCULAR | Status: AC
  Filled 2018-01-17: qty 2

## 2018-01-17 MED ORDER — FENTANYL CITRATE (PF) 100 MCG/2ML IJ SOLN: 25.0000 ug | INTRAMUSCULAR | Status: DC | PRN

## 2018-01-17 MED ORDER — ACETAMINOPHEN 500 MG PO TABS
500.0000 mg | ORAL_TABLET | Freq: Four times a day (QID) | ORAL | Status: DC
  Administered 2018-01-17 – 2018-01-18 (×3): 500 mg via ORAL
  Filled 2018-01-17 (×3): qty 1

## 2018-01-17 MED ORDER — OXYCODONE HCL 5 MG PO TABS: 5.0000 mg | ORAL_TABLET | Freq: Once | ORAL | Status: DC | PRN

## 2018-01-17 MED ORDER — ENOXAPARIN SODIUM 40 MG/0.4ML ~~LOC~~ SOLN
40.0000 mg | SUBCUTANEOUS | Status: DC
  Administered 2018-01-18 – 2018-01-19 (×2): 40 mg via SUBCUTANEOUS
  Filled 2018-01-17 (×2): qty 0.4

## 2018-01-17 MED ORDER — MAGNESIUM HYDROXIDE 400 MG/5ML PO SUSP
30.0000 mL | Freq: Every day | ORAL | Status: DC | PRN
  Administered 2018-01-18: 30 mL via ORAL
  Filled 2018-01-17: qty 30

## 2018-01-17 MED ORDER — MIDAZOLAM HCL 2 MG/2ML IJ SOLN
INTRAMUSCULAR | Status: AC
  Filled 2018-01-17: qty 2

## 2018-01-17 MED ORDER — ALUM & MAG HYDROXIDE-SIMETH 200-200-20 MG/5ML PO SUSP: 30.0000 mL | ORAL | Status: DC | PRN

## 2018-01-17 MED ORDER — BISACODYL 10 MG RE SUPP: 10.0000 mg | Freq: Every day | RECTAL | Status: DC | PRN

## 2018-01-17 MED ORDER — BUPIVACAINE HCL (PF) 0.5 % IJ SOLN
INTRAMUSCULAR | Status: AC
  Filled 2018-01-17: qty 10

## 2018-01-17 MED ORDER — METOCLOPRAMIDE HCL 5 MG/ML IJ SOLN: 5.0000 mg | Freq: Three times a day (TID) | INTRAMUSCULAR | Status: DC | PRN

## 2018-01-17 MED ORDER — KETAMINE HCL 50 MG/ML IJ SOLN
INTRAMUSCULAR | Status: AC
  Filled 2018-01-17: qty 10

## 2018-01-17 SURGICAL SUPPLY — 30 items
BIT DRILL 4.3MMS DISTAL GRDTED (BIT) ×1 IMPLANT
CANISTER SUCT 1200ML W/VALVE (MISCELLANEOUS) ×3 IMPLANT
CHLORAPREP W/TINT 26ML (MISCELLANEOUS) ×3 IMPLANT
DRAPE SHEET LG 3/4 BI-LAMINATE (DRAPES) ×3 IMPLANT
DRAPE U-SHAPE 47X51 STRL (DRAPES) ×3 IMPLANT
DRILL 4.3MMS DISTAL GRADUATED (BIT) ×3
DRSG OPSITE POSTOP 3X4 (GAUZE/BANDAGES/DRESSINGS) ×9 IMPLANT
GLOVE BIOGEL PI IND STRL 9 (GLOVE) ×3 IMPLANT
GLOVE BIOGEL PI INDICATOR 9 (GLOVE) ×6
GLOVE SURG SYN 9.0  PF PI (GLOVE) ×2
GLOVE SURG SYN 9.0 PF PI (GLOVE) ×1 IMPLANT
GOWN SRG 2XL LVL 4 RGLN SLV (GOWNS) ×1 IMPLANT
GOWN STRL NON-REIN 2XL LVL4 (GOWNS) ×2
GOWN STRL REUS W/ TWL LRG LVL3 (GOWN DISPOSABLE) ×1 IMPLANT
GOWN STRL REUS W/TWL LRG LVL3 (GOWN DISPOSABLE) ×2
GUIDEPIN VERSANAIL DSP 3.2X444 (ORTHOPEDIC DISPOSABLE SUPPLIES) ×3 IMPLANT
GUIDEWIRE BALL NOSE 80CM (WIRE) ×3 IMPLANT
HFN RH 130 DEG 9MM X 320MM (Nail) ×3 IMPLANT
KIT TURNOVER KIT A (KITS) ×3 IMPLANT
MAT BLUE FLOOR 46X72 FLO (MISCELLANEOUS) ×3 IMPLANT
NEEDLE FILTER BLUNT 18X 1/2SAF (NEEDLE) ×2
NEEDLE FILTER BLUNT 18X1 1/2 (NEEDLE) ×1 IMPLANT
NS IRRIG 500ML POUR BTL (IV SOLUTION) ×3 IMPLANT
PACK HIP COMPR (MISCELLANEOUS) ×3 IMPLANT
SCREW BONE CORTICAL 5.0X40 (Screw) ×3 IMPLANT
SCREW LAG HIP NAIL 10.5X95 (Screw) ×3 IMPLANT
STAPLER SKIN PROX 35W (STAPLE) ×3 IMPLANT
SUT VIC AB 1 CT1 36 (SUTURE) ×3 IMPLANT
SUT VIC AB 2-0 CT1 (SUTURE) ×3 IMPLANT
SYR 10ML LL (SYRINGE) ×3 IMPLANT

## 2018-01-17 NOTE — Anesthesia Preprocedure Evaluation (Signed)
Anesthesia Evaluation  Patient identified by MRN, date of birth, ID band Patient awake    Reviewed: Allergy & Precautions, H&P , NPO status , Patient's Chart, lab work & pertinent test results  History of Anesthesia Complications (+) PONV and history of anesthetic complications  Airway Mallampati: III  TM Distance: >3 FB Neck ROM: full    Dental  (+) Chipped, Poor Dentition   Pulmonary neg pulmonary ROS, neg shortness of breath,           Cardiovascular Exercise Tolerance: Good hypertension, (-) angina(-) Past MI and (-) DOE      Neuro/Psych negative neurological ROS  negative psych ROS   GI/Hepatic negative GI ROS, Neg liver ROS,   Endo/Other  diabetes, Type 2  Renal/GU      Musculoskeletal   Abdominal   Peds  Hematology negative hematology ROS (+)   Anesthesia Other Findings Past Medical History: No date: Diabetes mellitus without complication (HCC) No date: HOH (hard of hearing) No date: Hypertension No date: PONV (postoperative nausea and vomiting)  Past Surgical History: No date: ABDOMINAL HYSTERECTOMY 04/18/2015: CATARACT EXTRACTION W/PHACO; Right     Comment:  Procedure: CATARACT EXTRACTION PHACO AND INTRAOCULAR               LENS PLACEMENT (IOC);  Surgeon: Galen ManilaWilliam Porfilio, MD;                Location: [facility] ORS;  Service: Ophthalmology;  Laterality:              Right;  US   00:58.9 AP    24.1 CDE   14.19 casette               lot# No date: CHOLECYSTECTOMY No date: GANGLION CYST EXCISION  BMI    Body Mass Index:  19.19 kg/m      Reproductive/Obstetrics negative OB ROS                             Anesthesia Physical Anesthesia Plan  ASA: III  Anesthesia Plan: Spinal   Post-op Pain Management:    Induction:   PONV Risk Score and Plan:   Airway Management Planned: Natural Airway and Nasal Cannula  Additional Equipment:   Intra-op Plan:   Post-operative  Plan:   Informed Consent: I have reviewed the patients History and Physical, chart, labs and discussed the procedure including the risks, benefits and alternatives for the proposed anesthesia with the patient or authorized representative who has indicated his/her understanding and acceptance.   Dental Advisory Given  Plan Discussed with: Anesthesiologist, CRNA and Surgeon  Anesthesia Plan Comments: (Patient reports no bleeding problems and no anticoagulant use.  Plan for spinal with backup GA  Patient consented for risks of anesthesia including but not limited to:  - adverse reactions to medications - risk of bleeding, infection, nerve damage and headache - risk of failed spinal - damage to teeth, lips or other oral mucosa - sore throat or hoarseness - Damage to heart, brain, lungs or loss of life  Patient voiced understanding.)        Anesthesia Quick Evaluation

## 2018-01-17 NOTE — Consult Note (Signed)
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  She was at church and child ran up and knocked her down causing hip fracture.  She did not have loss of consciousness.  X-rays in the emergency room show minimally displaced high intertrochanteric hip fracture  Physical exam her right leg is externally rotated and slightly shortened with palpable pulses and intact sensation.  She is able to flex extend the toes.  No ecchymosis around the thigh.  Impression is right intertrochanteric hip fracture in an active 84 year old  Plan: ORIF with IM device later today.  Risks, benefits, alternatives discussed with patient and family.  Site marked.

## 2018-01-17 NOTE — Progress Notes (Addendum)
Patient ID: Amy Aguirre, female   DOB: October 12, 1924, 84 y.o.   MRN: [id number]  [employer] [facility] PROGRESS NOTE  Elisabetta Mishra XLK:[id number] DOB: 07-25-1925 DOA: 01/16/2018 PCP: Jaclyn Shaggy, MD  HPI/Subjective: Patient was knocked over and broke her hip.  No complaints of chest pain or shortness of breath.  Patient normally walks without any devices and mows her lawn and is very active.  Objective: Vitals:   01/17/18 0508 01/17/18 0751  BP: (!) 153/96 (!) 153/60  Pulse: 67 62  Resp:    Temp:  97.7 F (36.5 C)  SpO2:  96%   No intake or output data in the 24 hours ending 01/17/18 0833 Filed Weights   01/16/18 1937 01/17/18 0508  Weight: 42.6 kg (94 lb) 43.1 kg (95 lb)    ROS: Review of Systems  Constitutional: Negative for chills and fever.  Eyes: Negative for blurred vision.  Respiratory: Negative for cough and shortness of breath.   Cardiovascular: Negative for chest pain.  Gastrointestinal: Negative for abdominal pain, constipation, diarrhea, nausea and vomiting.  Genitourinary: Negative for dysuria.  Musculoskeletal: Positive for joint pain.  Neurological: Negative for dizziness and headaches.   Exam: Physical Exam  Constitutional: She is oriented to person, place, and time.  HENT:  Nose: No mucosal edema.  Mouth/Throat: No oropharyngeal exudate or posterior oropharyngeal edema.  Eyes: Pupils are equal, round, and reactive to light. Conjunctivae, EOM and lids are normal.  Neck: No JVD present. Carotid bruit is not present. No edema present. No thyroid mass and no thyromegaly present.  Cardiovascular: S1 normal and S2 normal. Exam reveals no gallop.  No murmur heard. Pulses:      Dorsalis pedis pulses are 2+ on the right side, and 2+ on the left side.  Respiratory: No respiratory distress. She has no wheezes. She has no rhonchi. She has no rales.  GI: Soft. Bowel sounds are normal. There is no tenderness.  Musculoskeletal:       Right ankle: She exhibits no  swelling.       Left ankle: She exhibits no swelling.  Lymphadenopathy:    She has no cervical adenopathy.  Neurological: She is alert and oriented to person, place, and time. No cranial nerve deficit.  Sensation intact right lower extremity.  Able to flex and extend right ankle.  Skin: Skin is warm. No rash noted. Nails show no clubbing.  Psychiatric: She has a normal mood and affect.      Data Reviewed: Basic Metabolic Panel: Recent Labs  Lab 01/16/18 2059 01/17/18 0457  NA 136 138  K 3.8 4.2  CL 104 106  CO2 20* 24  GLUCOSE 222* 124*  BUN 26* 22*  CREATININE 1.26* 1.05*  CALCIUM 9.1 9.5   CBC: Recent Labs  Lab 01/16/18 2059 01/17/18 0457  WBC 7.3 8.9  HGB 10.0* 9.1*  HCT 29.4* 26.2*  MCV 101.2* 100.6*  PLT 230 209    CBG: Recent Labs  Lab 01/17/18 0011 01/17/18 0753  GLUCAP 211* 141*    Recent Results (from the past 240 hour(s))  MRSA PCR Screening     Status: None   Collection Time: 01/16/18 11:04 PM  Result Value Ref Range Status   MRSA by PCR NEGATIVE NEGATIVE Final    Comment:        The GeneXpert MRSA Assay (FDA approved for NASAL specimens only), is one component of a comprehensive MRSA colonization surveillance program. It is not intended to diagnose MRSA infection nor to guide or monitor  treatment for MRSA infections. Performed at [facility], [address] [place], [place] [address]      Studies: Ct Head Wo Contrast  Result Date: 01/16/2018 CLINICAL DATA:  Fall and hit head hematoma to the right temple EXAM: CT HEAD WITHOUT CONTRAST TECHNIQUE: Contiguous axial images were obtained from the base of the skull through the vertex without intravenous contrast. COMPARISON:  11/11/2017 FINDINGS: Brain: No acute territorial infarction, hemorrhage or intracranial mass is visualized. Moderate atrophy. Mild small vessel ischemic changes of the white matter. Stable ventricle size. Vascular: No hyperdense vessels.  Carotid vascular  calcification Skull: Old left sub occipital and occipital bone fracture. No definite acute fracture is seen Sinuses/Orbits: No acute finding. Other: None. IMPRESSION: 1. No CT evidence for acute intracranial abnormality. 2. Atrophy and mild small vessel ischemic changes of the white matter 3. Old left occipital bone fracture Electronically Signed   By: Jasmine Pang M.D.   On: 01/16/2018 20:12   Dg Chest Portable 1 View  Result Date: 01/16/2018 CLINICAL DATA:  Larey Seat at home.  Hip fracture. EXAM: PORTABLE CHEST 1 VIEW COMPARISON:  None. FINDINGS: Cardiac silhouette is normal in size. No mediastinal or hilar masses. No evidence of adenopathy. Clear lungs.  No pleural effusion or pneumothorax. Skeletal structures are grossly intact. IMPRESSION: No active disease. Electronically Signed   By: Amie Portland M.D.   On: 01/16/2018 20:33   Dg Hip Unilat  With Pelvis 2-3 Views Right  Result Date: 01/16/2018 CLINICAL DATA:  Larey Seat at home.  Right hip pain. EXAM: DG HIP (WITH OR WITHOUT PELVIS) 2-3V RIGHT COMPARISON:  None. FINDINGS: There is a nondisplaced, nonangulated intertrochanteric fracture of the proximal right femur. There are separate nondisplaced fracture components from the superior greater trochanter and lesser trochanter. No other fractures. Hip joints are normally spaced and aligned as are the SI joints and symphysis pubis. Bones are demineralized. IMPRESSION: 1. Nondisplaced intertrochanteric fracture of the proximal right femur. No dislocation. Electronically Signed   By: Amie Portland M.D.   On: 01/16/2018 20:32    Scheduled Meds: . allopurinol  100 mg Oral Daily  . atenolol  100 mg Oral Daily  . calcium-vitamin D   Oral QHS  . docusate sodium  100 mg Oral BID  . Ferrous Fumarate   Oral QHS  . insulin aspart  0-5 Units Subcutaneous QHS  . insulin aspart  0-9 Units Subcutaneous TID WC  . irbesartan  150 mg Oral Daily  . mirtazapine  15 mg Oral QHS  . multivitamin with minerals  1 tablet Oral  Daily  . potassium chloride  10 mEq Oral Daily  . simvastatin  40 mg Oral Daily   Continuous Infusions: . sodium chloride 75 mL/hr at 01/16/18 2322  .  ceFAZolin (ANCEF) IV      Assessment/Plan:  1. Preoperative consultation for right hip fracture.  No contraindications to surgery at this time.  Surgery must be done to prevent complications cancers skin breakdown pneumonia and blood clot. 2. Essential hypertension.  Likely higher secondary to pain.  Continue usual medications. 3. Hyperlipidemia unspecified on simvastatin 4. Type 2 diabetes mellitus.  On sliding scale for now.  Likely will stop glimepiride.  Continue metformin tomorrow. 5. History of gout on allopurinol 6. History of chronic anemia.  On iron.  Watch postoperatively.  Send off a ferritin.  Code Status:     Code Status Orders  (From admission, onward)        Start  Ordered   01/16/18 2253  Full code  Continuous     01/16/18 2252    Code Status History    Date Active Date Inactive Code Status Order ID Comments User Context   11/10/2017 2319 11/13/2017 1842 Full Code [id number]  Almond LintByerly, Faera, MD Inpatient     Family Communication: Family at bedside Disposition Plan: To be determined depending on how she does with physical therapy postoperatively  Consultants:  Orthopedic surgery  Time spent: 26 minutes    Lam Bjorklund [employer]  [employer]

## 2018-01-17 NOTE — Anesthesia Post-op Follow-up Note (Signed)
Anesthesia QCDR form completed.        

## 2018-01-17 NOTE — Transfer of Care (Signed)
Immediate Anesthesia Transfer of Care Note  Patient: Amy Aguirre  Procedure(s) Performed: INTRAMEDULLARY (IM) NAIL INTERTROCHANTRIC (Right )  Patient Location: PACU  Anesthesia Type:Spinal  Level of Consciousness: sedated  Airway & Oxygen Therapy: Patient Spontanous Breathing and Patient connected to nasal cannula oxygen  Post-op Assessment: Report given to RN and Post -op Vital signs reviewed and stable  Post vital signs: Reviewed and stable  Last Vitals:  Vitals Value Taken Time  BP 119/43 01/17/2018 12:42 PM  Temp 36.2 C 01/17/2018 12:41 PM  Pulse 53 01/17/2018 12:44 PM  Resp 17 01/17/2018 12:44 PM  SpO2 100 % 01/17/2018 12:44 PM  Vitals shown include unvalidated device data.  Last Pain:  Vitals:   01/17/18 1241  TempSrc:   PainSc: Asleep         Complications: No apparent anesthesia complications

## 2018-01-17 NOTE — Progress Notes (Signed)
BP 213/75. MD notified. Order for norvasc 5mg  X1 dose obtained.

## 2018-01-17 NOTE — Anesthesia Procedure Notes (Signed)
Spinal  Start time: 01/17/2018 11:48 AM End time: 01/17/2018 11:50 AM Staffing Resident/CRNA: Clovis Fredricksonrisson, Sally-Ann Cutbirth, CRNA Performed: resident/CRNA  Preanesthetic Checklist Completed: patient identified, site marked, surgical consent, pre-op evaluation, timeout performed, IV checked, risks and benefits discussed and monitors and equipment checked Spinal Block Patient position: sitting Prep: ChloraPrep Patient monitoring: heart rate, cardiac monitor, continuous pulse ox and blood pressure Approach: midline Location: L4-5 Injection technique: single-shot Needle Needle type: Whitacre  Needle gauge: 22 G Needle length: 9 cm Assessment Sensory level: T8 Additional Notes Patient tolerated procedure well.  Neg heme, neg paresthesia, clear CSF.

## 2018-01-17 NOTE — Op Note (Signed)
01/17/2018  12:38 PM  PATIENT:  Amy Aguirre  84 y.o. female  PRE-OPERATIVE DIAGNOSIS:  right hip fracture intertrochanteric  POST-OPERATIVE DIAGNOSIS:  same  PROCEDURE:  Procedure(s): INTRAMEDULLARY (IM) NAIL INTERTROCHANTRIC (Right)  SURGEON: Leitha SchullerMichael J Aicia Babinski, MD  ASSISTANTS: None  ANESTHESIA:   spinal  EBL:  Total I/O In: 400 [I.V.:400] Out: -   BLOOD ADMINISTERED:none  DRAINS: none   LOCAL MEDICATIONS USED:  NONE  SPECIMEN:  No Specimen  DISPOSITION OF SPECIMEN:  N/A  COUNTS:  YES  TOURNIQUET:  * No tourniquets in log *  IMPLANTS: Biomet affixes 9 x 320 mm rod right with 95 mm leg screw and 40 mm interlocking screw   DICTATION: .Dragon Dictation   patient brought the operating room and after adequate site spinal anesthesia was obtained the patient was placed on the fracture table.  Traction was applied to the right foot traction boot and C-arm showed essentially anatomic alignment in AP and lateral projections.  The hip was then prepped and draped using a barrier drape method and after appropriate patient identification and timeout procedure were completed a small incision was made at the tip of the trochanter and a guidewire inserted in the appropriate position.  Reaming was carried out proximally followed by placement of a long guidewire and rod length determined.  Reaming was then carried out to 11 mm and the 9 mm rod was inserted to the appropriate depth.  A small lateral incision was made a guidewire inserted into a near center center position measurement made off of this drilling and then placing the 95 mm leg screw followed by release of traction and compression to the compression device.  The set screw was placed proximally with a quarter turn to allow for further compression.  The insertion handle was removed at this point with permanent AP lateral images having been obtained.  Going distally a small incision was made at the level of the oblique screw and drilling  was carried out followed by measuring and placing 40mm screw distally for interlocking.  The wounds were then irrigated and closed with #1 Vicryl for the deep fascia 2-0 Vicryl subcutaneously and skin staples followed by Xeroform and honeycomb dressing   PLAN OF CARE: Continue as inpatient  PATIENT DISPOSITION:  PACU - hemodynamically stable.

## 2018-01-18 LAB — BASIC METABOLIC PANEL
Anion gap: 5 (ref 5–15)
BUN: 16 mg/dL (ref 6–20)
CALCIUM: 8.7 mg/dL — AB (ref 8.9–10.3)
CO2: 26 mmol/L (ref 22–32)
CREATININE: 1.01 mg/dL — AB (ref 0.44–1.00)
Chloride: 106 mmol/L (ref 101–111)
GFR calc Af Amer: 55 mL/min — ABNORMAL LOW (ref >60)
GFR, EST NON AFRICAN AMERICAN: 48 mL/min — AB (ref >60)
GLUCOSE: 149 mg/dL — AB (ref 65–99)
Potassium: 4.4 mmol/L (ref 3.5–5.1)
Sodium: 137 mmol/L (ref 135–145)

## 2018-01-18 LAB — CBC
HCT: 24.6 % — ABNORMAL LOW (ref 35.0–47.0)
Hemoglobin: 8.5 g/dL — ABNORMAL LOW (ref 12.0–16.0)
MCH: 34.9 pg — AB (ref 26.0–34.0)
MCHC: 34.8 g/dL (ref 32.0–36.0)
MCV: 100.3 fL — AB (ref 80.0–100.0)
PLATELETS: 189 10*3/uL (ref 150–440)
RBC: 2.45 MIL/uL — ABNORMAL LOW (ref 3.80–5.20)
RDW: 13.6 % (ref 11.5–14.5)
WBC: 6.7 10*3/uL (ref 3.6–11.0)

## 2018-01-18 LAB — GLUCOSE, CAPILLARY
GLUCOSE-CAPILLARY: 123 mg/dL — AB (ref 65–99)
Glucose-Capillary: 167 mg/dL — ABNORMAL HIGH (ref 65–99)
Glucose-Capillary: 101 mg/dL — ABNORMAL HIGH (ref 65–99)
Glucose-Capillary: 180 mg/dL — ABNORMAL HIGH (ref 65–99)

## 2018-01-18 LAB — FERRITIN: Ferritin: 113 ng/mL (ref 11–307)

## 2018-01-18 MED ORDER — POLYETHYLENE GLYCOL 3350 17 G PO PACK
17.0000 g | PACK | Freq: Every day | ORAL | Status: DC
  Administered 2018-01-19: 17 g via ORAL
  Filled 2018-01-18 (×2): qty 1

## 2018-01-18 MED ORDER — METFORMIN HCL 500 MG PO TABS
500.0000 mg | ORAL_TABLET | Freq: Two times a day (BID) | ORAL | Status: DC
  Administered 2018-01-18 – 2018-01-19 (×2): 500 mg via ORAL
  Filled 2018-01-18 (×3): qty 1

## 2018-01-18 NOTE — Evaluation (Addendum)
Occupational Therapy Evaluation Patient Details Name: Amy Aguirre MRN: [id number] DOB: 12-19-1924 Today's Date: 01/18/2018    History of Present Illness Amy Aguirre is a 84yo white female who [facility] to [facility] on [facility] after fall at church when child ran into her and knocked her over. PMH: DM2, HTN, prior fall in April 2019 with [facility] and cerebellum contusion. PTA pt lives alone, fully independen tcommunity dweller without assistive device, still drives independent in IADL.    Clinical Impression   Met pt sitting up in chair, family present at bedside, agreeable to OT this date. Pt reports little-no pain this date. Pt previously very independent in all ADL/IADL and is looking to continue to maintain an active lifestyle. At baseline pt bathes in tub, education given on acquiring and using a tub bench in the home- pt states family purchased one and needs to be assembled but unsure of exact style purchased. Sit <> stand from chair completed with min A and VC's for appropriate maneuvering of 2WW. Sit <> supine completed with min A (slight assist needed for RLE). Pt with tendency to allow feet to get ahead of 2WW and experienced pain from not properly bearing weight, corrected with VC's. Pt would benefit from continued instruction of mastering 2WW use during functional mobility, still very awkward and hesitant. Pt also would benefit from practicing toilet t/f to ensure safe carry over into the home. Pt would also benefit from adaptive LB dressing equipment to maintain functional independence in the home environment. Due to previously active and independent lifestyle, anticipate most functional gain with [facility] therapy services.    Follow Up Recommendations  Home health OT;Supervision - Intermittent Intermittent supervision from family to ensure safety in transition to home   Equipment Recommendations  Toilet rise with handles;Tub/shower bench;Other (comment)(pt reports family supplied some form of shower  seat/bench need to ensure what style)    Recommendations for Other Services       Precautions / Restrictions Precautions Precautions: Fall Restrictions Weight Bearing Restrictions: Yes RLE Weight Bearing: Weight bearing as tolerated      Mobility Bed Mobility Overal bed mobility: Needs Assistance Bed Mobility: Supine to Sit     Supine to sit: Min assist     General bed mobility comments: pt appropriately using bed rails to support self, needs some assist to manage RLE  Transfers Overall transfer level: Needs assistance Equipment used: Rolling walker (2 wheeled) Transfers: Sit to/from Stand Sit to Stand: Min assist         General transfer comment: pt reports sitting in chair for close to 4 hours, needing min A to sit <> stand and VC's to bear weight into walker     Balance Overall balance assessment: History of Falls;Needs assistance Sitting-balance support: Bilateral upper extremity supported;Feet supported Sitting balance-Leahy Scale: Good     Standing balance support: Bilateral upper extremity supported Standing balance-Leahy Scale: Fair                             ADL either performed or assessed with clinical judgement   ADL Overall ADL's : Needs assistance/impaired Eating/Feeding: Independent   Grooming: Independent Grooming Details (indicate cue type and reason): pt with mirror and personal comb fixing hair (gets hair done, does not wash own hair) Upper Body Bathing: Sitting;Independent Upper Body Bathing Details (indicate cue type and reason): pt needing shower chair at this time (at baseline sits in tub) Lower Body Bathing: Maximal assistance;Sit to/from stand  Lower Body Bathing Details (indicate cue type and reason): 2/2 to pain with R hip flexion  Upper Body Dressing : Sitting;Independent   Lower Body Dressing: Maximal assistance;Sit to/from stand Lower Body Dressing Details (indicate cue type and reason): 2/2 to pain with R hip  flexion  Toilet Transfer: Moderate assistance;RW;Grab bars Toilet Transfer Details (indicate cue type and reason): anticipate mod A for 2WW instruction and VC's for hand placement to and from toilet Toileting- Clothing Manipulation and Hygiene: Set up;Sit to/from stand   Tub/ Shower Transfer: Minimal assistance;Tub bench;Rolling walker     General ADL Comments: pt needing most asssist to adress LB ADLs and higher level functional t/fs     Vision Baseline Vision/History: Wears glasses Wears Glasses: At all times Patient Visual Report: No change from baseline       Perception     Praxis      Pertinent Vitals/Pain Pain Assessment: Faces Faces Pain Scale: Hurts a little bit Pain Location: R hip/thigh with initial standing Pain Descriptors / Indicators: Aching Pain Intervention(s): Monitored during session;Limited activity within patient's tolerance;Repositioned     Hand Dominance     Extremity/Trunk Assessment Upper Extremity Assessment Upper Extremity Assessment: Overall WFL for tasks assessed   Lower Extremity Assessment Lower Extremity Assessment: Overall WFL for tasks assessed       Communication Communication Communication: No difficulties   Cognition Arousal/Alertness: Awake/alert Behavior During Therapy: WFL for tasks assessed/performed Overall Cognitive Status: Within Functional Limits for tasks assessed                                     General Comments       Exercises     Shoulder Instructions      Home Living Family/patient expects to be discharged to:: Private residence Living Arrangements: Alone Available Help at Discharge: Family;Available PRN/intermittently;Other (Comment)(family lives within minutes) Type of Home: House Home Access: Stairs to enter [employer] of Steps: 2 Entrance Stairs-Rails: Can reach both Home Layout: One level     Bathroom Shower/Tub: Teacher, early years/pre: Standard          Additional Comments: Pt with independent baseline of active community integration, driving, IADL independence. Currently sits inside tub to bathe      Prior Functioning/Environment Level of Independence: Independent                 OT Problem List: Decreased knowledge of use of DME or AE;Decreased range of motion;Pain      OT Treatment/Interventions: Self-care/ADL training;DME and/or AE instruction    OT Goals(Current goals can be found in the care plan section) Acute Rehab OT Goals Patient Stated Goal: to remain as independent and engage in as many ADLs/IADLs as before OT Goal Formulation: With patient Time For Goal Achievement: 02/01/18 Potential to Achieve Goals: Good  OT Frequency: Min 2X/week   Barriers to D/C:            Co-evaluation              AM-PAC PT "6 Clicks" Daily Activity     Outcome Measure Help from another person eating meals?: None Help from another person taking care of personal grooming?: None Help from another person toileting, which includes using toliet, bedpan, or urinal?: A Lot Help from another person bathing (including washing, rinsing, drying)?: A Lot Help from another person to put on and taking off regular upper body  clothing?: None Help from another person to put on and taking off regular lower body clothing?: A Lot 6 Click Score: 18   End of Session Equipment Utilized During Treatment: Gait belt;Rolling walker  Activity Tolerance: Patient tolerated treatment well Patient left: in bed;with call bell/phone within reach;with bed alarm set;with family/visitor present;with SCD's reapplied  OT Visit Diagnosis: Pain;History of falling (Z91.81);Other abnormalities of gait and mobility (R26.89) Pain - Right/Left: Right Pain - part of body: Hip;Leg                Time: 6759-1638 OT Time Calculation (min): 32 min Charges:  OT General Charges $OT Visit: 1 Visit OT Evaluation $OT Eval Low Complexity: 1 Low OT Treatments $Self  Care/Home Management : 8-22 mins G-Codes:     Zenovia Jarred, MSOT, OTR/L  [place] 01/18/2018, 5:12 PM

## 2018-01-18 NOTE — Progress Notes (Signed)
Pt alert and oriented. No complaints of pain during the night. Voiding without difficulty. Iv infusing without difficulty. Surgical dressing with small amount of drainage to top incision. Pt has been able to sleep in between care.

## 2018-01-18 NOTE — Progress Notes (Signed)
   Subjective: 1 Day Post-Op Procedure(s) (LRB): INTRAMEDULLARY (IM) NAIL INTERTROCHANTRIC (Right) Patient reports pain as mild.   Patient is well, and has had no acute complaints or problems Denies any CP, SOB, ABD pain. We will start therapy today.   Objective: Vital signs in last 24 hours: Temp:  [97.2 F (36.2 C)-98.2 F (36.8 C)] 98.1 F (36.7 C) (06/23 0351) Pulse Rate:  [54-72] 72 (06/23 0351) Resp:  [14-19] 19 (06/23 0351) BP: (107-173)/(43-74) 165/65 (06/23 0351) SpO2:  [93 %-100 %] 100 % (06/23 0351) Weight:  [40.8 kg (90 lb)] 40.8 kg (90 lb) (06/23 0653)  Intake/Output from previous day: 06/22 0701 - 06/23 0700 In: 3165.8 [I.V.:2922.5; IV Piggyback:243.3] Out: 308 [Urine:258; Blood:50] Intake/Output this shift: No intake/output data recorded.  Recent Labs    01/16/18 2059 01/17/18 0457 01/18/18 0357  HGB 10.0* 9.1* 8.5*   Recent Labs    01/17/18 0457 01/18/18 0357  WBC 8.9 6.7  RBC 2.61* 2.45*  HCT 26.2* 24.6*  PLT 209 189   Recent Labs    01/17/18 0457 01/18/18 0357  NA 138 137  K 4.2 4.4  CL 106 106  CO2 24 26  BUN 22* 16  CREATININE 1.05* 1.01*  GLUCOSE 124* 149*  CALCIUM 9.5 8.7*   Recent Labs    01/17/18 0759  INR 1.19    EXAM General - Patient is Alert, Appropriate and Oriented Extremity - Neurovascular intact Sensation intact distally Intact pulses distally Dorsiflexion/Plantar flexion intact No cellulitis present Compartment soft Dressing - dressing C/D/I and scant drainage Motor Function - intact, moving foot and toes well on exam.   Past Medical History:  Diagnosis Date  . Diabetes mellitus without complication (HCC)   . HOH (hard of hearing)   . Hypertension   . PONV (postoperative nausea and vomiting)     Assessment/Plan:   1 Day Post-Op Procedure(s) (LRB): INTRAMEDULLARY (IM) NAIL INTERTROCHANTRIC (Right) Active Problems:   Hip fx (HCC)  Estimated body mass index is 18.18 kg/m as calculated from the  following:   Height as of this encounter: 4\' 11"  (1.499 m).   Weight as of this encounter: 40.8 kg (90 lb). Advance diet Up with therapy  Needs BM Acute post op blood loss anemia - Hgb 8.5. Continue with Iron supplement.  Recheck labs in the am CM to assist with discharge  DVT Prophylaxis - Lovenox, Foot Pumps and TED hose Weight-Bearing as tolerated to right leg   T. Cranston Neighborhris Raygen Linquist, PA-C [facility] 01/18/2018, 7:36 AM

## 2018-01-18 NOTE — Anesthesia Postprocedure Evaluation (Signed)
Anesthesia Post Note  Patient: Amy Aguirre  Procedure(s) Performed: INTRAMEDULLARY (IM) NAIL INTERTROCHANTRIC (Right )  Patient location during evaluation: Nursing Unit Anesthesia Type: Spinal Level of consciousness: oriented and awake and alert Pain management: pain level controlled Vital Signs Assessment: post-procedure vital signs reviewed and stable Respiratory status: spontaneous breathing, respiratory function stable and nonlabored ventilation Cardiovascular status: blood pressure returned to baseline and stable Postop Assessment: no headache, no backache, no apparent nausea or vomiting and patient able to bend at knees Anesthetic complications: no     Last Vitals:  Vitals:   01/18/18 1203 01/18/18 1643  BP: (!) 116/52 (!) 146/68  Pulse: 67 81  Resp:    Temp: 36.8 C 36.9 C  SpO2: 97% 100%    Last Pain:  Vitals:   01/18/18 0951  TempSrc:   PainSc: 2                  Lenard SimmerAndrew Shivan Hodes

## 2018-01-18 NOTE — Evaluation (Signed)
Physical Therapy Evaluation Patient Details Name: Harvey Lingo MRN: [id number] DOB: July 29, 1925 Today's Date: 01/18/2018   History of Present Illness  Amy Aguirre is a 84yo white female who comes to [facility] on 6/21 after fall at church when child ran into her and knocked her over. PMH: DM2, HTN, prior fall in April 2019 with [facility] and cerebellum contusion. PTA pt lives alone, fully independen tcommunity dweller without assistive device, still drives independent in [place].   Clinical Impression  Pt admitted with above diagnosis. Pt currently with functional limitations due to the deficits listed below (see "PT Problem List"). Upon entry, pt in bed, no family/caregiver present. The pt is awake and agreeable to participate.  The pt is alert and oriented x3, pleasant, conversational, and following simple commands consistently, albeit some HOH. HEP performed with less than modA for limb movement, but able to perform heel slides and SAQ with resistance or against gravity. Functional mobility assessment demonstrates increased effort/time requirements, good tolerance, and need for minimal physical assistance only, whereas the patient performed these at a higher level of independence PTA. Pt tolerates AMB with RW well, strong BUE use of RW, but does not tolerate weight bearing in a neutral hip position. As pt requires no physical assistance to rise from EOB, no LOB in AMB, and AMB up to 33ft, pt would do well with DC to home and HHPT, however PT recommending family check--in's every 2-3 hours for higher level needs. PT feels DC to STR would limit basic mobility too much compared to her baseline and her current capacity. Pt will benefit from skilled PT intervention to increase independence and safety with basic mobility in preparation for discharge to the venue listed below.       Follow Up Recommendations Home health PT;Supervision - Intermittent(would be safe with family checking in q2-3 hours )    Equipment  Recommendations  (youth RW)    Recommendations for Other Services       Precautions / Restrictions Precautions Precautions: Fall Restrictions Weight Bearing Restrictions: Yes RLE Weight Bearing: Weight bearing as tolerated      Mobility  Bed Mobility Overal bed mobility: Needs Assistance Bed Mobility: Supine to Sit     Supine to sit: Min assist     General bed mobility comments: strong effort given, some assistance needed for RLE management OOB   Transfers Overall transfer level: Needs assistance Equipment used: Rolling walker (2 wheeled);None Transfers: Sit to/from Stand Sit to Stand: Supervision;From elevated surface            Ambulation/Gait Ambulation/Gait assistance: Min guard;Supervision Gait Distance (Feet): 60 Feet Assistive device: (pediatric RW ) Gait Pattern/deviations: Antalgic;Step-to pattern Gait velocity: 0.15m/s    General Gait Details: Rt hip remains slightly flexed during gait, does not extend to neutral. weight bearing limited by comfort. Strong BUE use of RW.   Stairs            Wheelchair Mobility    Modified Rankin (Stroke Patients Only)       Balance Overall balance assessment: Modified Independent;History of Falls                                           Pertinent Vitals/Pain Pain Assessment: Faces Faces Pain Scale: Hurts a little bit Pain Location: Right thigh  Pain Descriptors / Indicators: Aching Pain Intervention(s): Monitored during session;Limited activity within patient's tolerance  Home Living Family/patient expects to be discharged to:: Private residence Living Arrangements: Alone Available Help at Discharge: Family;Available PRN/intermittently Type of Home: House Home Access: Stairs to enter Entrance Stairs-Rails: Right;Can reach Technical sales engineerboth;Left Entrance Stairs-Number of Steps: 2 (1 in rear with 1 railing)  Home Layout: One level Home Equipment: Grab bars - tub/shower Additional  Comments: Pt very independent. Drives, cooks, cleans, Psychologist, occupationalmows lawn etc. SIt sin tub to bathe.     Prior Function Level of Independence: Independent         Comments: mows her yard, drives, runs her own errands.     Hand Dominance   Dominant Hand: Right    Extremity/Trunk Assessment   Upper Extremity Assessment Upper Extremity Assessment: Overall WFL for tasks assessed    Lower Extremity Assessment Lower Extremity Assessment: Overall WFL for tasks assessed;RLE deficits/detail RLE: Unable to fully assess due to pain       Communication   Communication: No difficulties  Cognition Arousal/Alertness: Awake/alert Behavior During Therapy: WFL for tasks assessed/performed Overall Cognitive Status: Within Functional Limits for tasks assessed                                        General Comments      Exercises Total Joint Exercises Ankle Circles/Pumps: AROM;15 reps;Supine Gluteal Sets: AROM;Right;10 reps;Supine Heel Slides: AROM;AAROM;Right;Supine;10 reps Hip ABduction/ADduction: AAROM;Right;10 reps;Supine Long Arc Quad: AROM;Right;10 reps;Supine   Assessment/Plan    PT Assessment Patient needs continued PT services  PT Problem List Decreased range of motion;Decreased strength;Decreased activity tolerance;Pain       PT Treatment Interventions DME instruction;Gait training;Stair training;Functional mobility training;Therapeutic activities;Therapeutic exercise;Patient/family education    PT Goals (Current goals can be found in the Care Plan section)  Acute Rehab PT Goals Patient Stated Goal: return to home, remain active and independent  PT Goal Formulation: With patient Time For Goal Achievement: 02/01/18 Potential to Achieve Goals: Good    Frequency BID   Barriers to discharge Inaccessible home environment      Co-evaluation               AM-PAC PT "6 Clicks" Daily Activity  Outcome Measure Difficulty turning over in bed (including  adjusting bedclothes, sheets and blankets)?: A Little Difficulty moving from lying on back to sitting on the side of the bed? : A Little Difficulty sitting down on and standing up from a chair with arms (e.g., wheelchair, bedside commode, etc,.)?: A Little Help needed moving to and from a bed to chair (including a wheelchair)?: A Little Help needed walking in hospital room?: A Little Help needed climbing 3-5 steps with a railing? : A Little 6 Click Score: 18    End of Session Equipment Utilized During Treatment: Gait belt Activity Tolerance: Patient tolerated treatment well Patient left: in chair;with call bell/phone within reach Nurse Communication: Mobility status PT Visit Diagnosis: Difficulty in walking, not elsewhere classified (R26.2)    Time: 1191-47820948-1021 PT Time Calculation (min) (ACUTE ONLY): 33 min   Charges:   PT Evaluation $PT Eval Moderate Complexity: 1 Mod PT Treatments $Therapeutic Activity: 8-22 mins   PT G Codes:        10:45 AM, 01/18/18 Rosamaria LintsAllan C Buccola, PT, DPT Physical Therapist - [facility]  [phone] (ASCOM)     Buccola,Allan C 01/18/2018, 10:41 AM

## 2018-01-18 NOTE — Progress Notes (Signed)
Patient ID: Amy Aguirre, female   DOB: [birth year], 84 y.o.   MRN: [id number]  [employer] PROGRESS NOTE  Amy Aguirre QMV:[id number] DOB: 26-Oct-1924 DOA: 01/16/2018 PCP: Amy Shaggy, MD  HPI/Subjective: Patient only has some soreness in her right hip.  Feels okay.  Did well with physical therapy.  Objective: Vitals:   01/18/18 0803 01/18/18 1203  BP: (!) 171/78 (!) 116/52  Pulse: 76 67  Resp:    Temp: 98.3 F (36.8 C) 98.3 F (36.8 C)  SpO2: 94% 97%    Filed Weights   01/16/18 1937 01/17/18 0508 01/18/18 0653  Weight: 42.6 kg (94 lb) 43.1 kg (95 lb) 40.8 kg (90 lb)    ROS: Review of Systems  Constitutional: Negative for chills and fever.  Eyes: Negative for blurred vision.  Respiratory: Negative for cough and shortness of breath.   Cardiovascular: Negative for chest pain.  Gastrointestinal: Negative for abdominal pain, constipation, diarrhea, nausea and vomiting.  Genitourinary: Negative for dysuria.  Musculoskeletal: Positive for joint pain.  Neurological: Negative for dizziness and headaches.   Exam: Physical Exam  Constitutional: She is oriented to person, place, and time.  HENT:  Nose: No mucosal edema.  Mouth/Throat: No oropharyngeal exudate or posterior oropharyngeal edema.  Eyes: Pupils are equal, round, and reactive to light. Conjunctivae, EOM and lids are normal.  Neck: No JVD present. Carotid bruit is not present. No edema present. No thyroid mass and no thyromegaly present.  Cardiovascular: S1 normal and S2 normal. Exam reveals no gallop.  No murmur heard. Pulses:      Dorsalis pedis pulses are 2+ on the right side, and 2+ on the left side.  Respiratory: No respiratory distress. She has no wheezes. She has no rhonchi. She has no rales.  GI: Soft. Bowel sounds are normal. There is no tenderness.  Musculoskeletal:       Right ankle: She exhibits no swelling.       Left ankle: She exhibits no swelling.  Lymphadenopathy:    She has no cervical  adenopathy.  Neurological: She is alert and oriented to person, place, and time. No cranial nerve deficit.  Sensation intact right lower extremity.  Able to flex and extend right ankle.  Skin: Skin is warm. No rash noted. Nails show no clubbing.  Psychiatric: She has a normal mood and affect.      Data Reviewed: Basic Metabolic Panel: Recent Labs  Lab 01/16/18 2059 01/17/18 0457 01/18/18 0357  NA 136 138 137  K 3.8 4.2 4.4  CL 104 106 106  CO2 20* 24 26  GLUCOSE 222* 124* 149*  BUN 26* 22* 16  CREATININE 1.26* 1.05* 1.01*  CALCIUM 9.1 9.5 8.7*   CBC: Recent Labs  Lab 01/16/18 2059 01/17/18 0457 01/18/18 0357  WBC 7.3 8.9 6.7  HGB 10.0* 9.1* 8.5*  HCT 29.4* 26.2* 24.6*  MCV 101.2* 100.6* 100.3*  PLT 230 209 189    CBG: Recent Labs  Lab 01/17/18 1657 01/17/18 1721 01/17/18 2113 01/18/18 0804 01/18/18 1206  GLUCAP 140* 143* 130* 123* 167*    Recent Results (from the past 240 hour(s))  MRSA PCR Screening     Status: None   Collection Time: 01/16/18 11:04 PM  Result Value Ref Range Status   MRSA by PCR NEGATIVE NEGATIVE Final    Comment:        The GeneXpert MRSA Assay (FDA approved for NASAL specimens only), is one component of a comprehensive MRSA colonization surveillance program. It is not intended  to diagnose MRSA infection nor to guide or monitor treatment for MRSA infections. Performed at [facility], [address] [place], [place] [address]      Studies: Ct Head Wo Contrast  Result Date: 01/16/2018 CLINICAL DATA:  Fall and hit head hematoma to the right temple EXAM: CT HEAD WITHOUT CONTRAST TECHNIQUE: Contiguous axial images were obtained from the base of the skull through the vertex without intravenous contrast. COMPARISON:  11/11/2017 FINDINGS: Brain: No acute territorial infarction, hemorrhage or intracranial mass is visualized. Moderate atrophy. Mild small vessel ischemic changes of the white matter. Stable ventricle size.  Vascular: No hyperdense vessels.  Carotid vascular calcification Skull: Old left sub occipital and occipital bone fracture. No definite acute fracture is seen Sinuses/Orbits: No acute finding. Other: None. IMPRESSION: 1. No CT evidence for acute intracranial abnormality. 2. Atrophy and mild small vessel ischemic changes of the white matter 3. Old left occipital bone fracture Electronically Signed   By: Jasmine Pang M.D.   On: 01/16/2018 20:12   Dg Chest Portable 1 View  Result Date: 01/16/2018 CLINICAL DATA:  Larey Seat at home.  Hip fracture. EXAM: PORTABLE CHEST 1 VIEW COMPARISON:  None. FINDINGS: Cardiac silhouette is normal in size. No mediastinal or hilar masses. No evidence of adenopathy. Clear lungs.  No pleural effusion or pneumothorax. Skeletal structures are grossly intact. IMPRESSION: No active disease. Electronically Signed   By: Amie Portland M.D.   On: 01/16/2018 20:33   Dg Hip Operative Unilat W Or W/o Pelvis Right  Result Date: 01/17/2018 CLINICAL DATA:  84 year old female status post right intramedullary nail placement. EXAM: OPERATIVE right HIP (WITH PELVIS IF PERFORMED) 5 VIEWS TECHNIQUE: Fluoroscopic spot image(s) were submitted for interpretation post-operatively. COMPARISON:  Pelvis and right hip 01/16/2018. FINDINGS: Five intraoperative fluoroscopic spot views demonstrate placement of gamma nail traversing the intertrochanteric fracture of the right hip with restoration of near anatomic alignment. No acute complicating features are noted. Femoral head is located. IMPRESSION: 1. Documentation of gamma nail fixation of right intertrochanteric hip fracture. Electronically Signed   By: Trudie Reed M.D.   On: 01/17/2018 14:35   Dg Hip Unilat  With Pelvis 2-3 Views Right  Result Date: 01/16/2018 CLINICAL DATA:  Larey Seat at home.  Right hip pain. EXAM: DG HIP (WITH OR WITHOUT PELVIS) 2-3V RIGHT COMPARISON:  None. FINDINGS: There is a nondisplaced, nonangulated intertrochanteric fracture of  the proximal right femur. There are separate nondisplaced fracture components from the superior greater trochanter and lesser trochanter. No other fractures. Hip joints are normally spaced and aligned as are the SI joints and symphysis pubis. Bones are demineralized. IMPRESSION: 1. Nondisplaced intertrochanteric fracture of the proximal right femur. No dislocation. Electronically Signed   By: Amie Portland M.D.   On: 01/16/2018 20:32    Scheduled Meds: . allopurinol  100 mg Oral Daily  . atenolol  100 mg Oral Daily  . calcium-vitamin D   Oral QHS  . docusate sodium  100 mg Oral BID  . enoxaparin (LOVENOX) injection  40 mg Subcutaneous Q24H  . Ferrous Fumarate   Oral QHS  . insulin aspart  0-5 Units Subcutaneous QHS  . insulin aspart  0-9 Units Subcutaneous TID WC  . irbesartan  150 mg Oral Daily  . mirtazapine  15 mg Oral QHS  . multivitamin with minerals  1 tablet Oral Daily  . polyethylene glycol  17 g Oral Daily  . potassium chloride  10 mEq Oral Daily  . simvastatin  40 mg Oral Daily  Continuous Infusions: . methocarbamol (ROBAXIN)  IV      Assessment/Plan:  1. Right hip fracture requiring operative repair.  Patient did well with physical therapy.  There is a potential to go home with home health on postoperative day 3. 2. Essential hypertension.   continue usual blood pressure medications. 3. Hyperlipidemia unspecified on simvastatin 4. Type 2 diabetes mellitus.  On sliding scale for now.  Likely will stop glimepiride.   restart Glucophage. 5. History of gout on allopurinol 6. History of chronic anemia.  On iron.  Code Status:     Code Status Orders  (From admission, onward)        Start     Ordered   01/16/18 2253  Full code  Continuous     01/16/18 2252    Code Status History    Date Active Date Inactive Code Status Order ID Comments User Context   11/10/2017 2319 11/13/2017 1842 Full Code [id number]  Almond LintByerly, Faera, MD Inpatient     Family Communication:   daughter at bedside. Disposition Plan:  potentially home with physical therapy and home health on postoperative day 3.  Consultants:  Orthopedic surgery  Time spent: 28 minutes    Amy Aguirre [employer]  [employer]

## 2018-01-19 ENCOUNTER — Encounter: Payer: Medicare Other | Admitting: Occupational Therapy

## 2018-01-19 ENCOUNTER — Encounter: Payer: Self-pay | Admitting: Orthopedic Surgery

## 2018-01-19 ENCOUNTER — Encounter: Payer: Medicare Other | Admitting: Physical Therapy

## 2018-01-19 LAB — BASIC METABOLIC PANEL
Anion gap: 8 (ref 5–15)
BUN: 20 mg/dL (ref 6–20)
CHLORIDE: 103 mmol/L (ref 101–111)
CO2: 25 mmol/L (ref 22–32)
Calcium: 8.6 mg/dL — ABNORMAL LOW (ref 8.9–10.3)
Creatinine, Ser: 1.19 mg/dL — ABNORMAL HIGH (ref 0.44–1.00)
GFR calc Af Amer: 45 mL/min — ABNORMAL LOW (ref >60)
GFR, EST NON AFRICAN AMERICAN: 39 mL/min — AB (ref >60)
Glucose, Bld: 153 mg/dL — ABNORMAL HIGH (ref 65–99)
POTASSIUM: 4 mmol/L (ref 3.5–5.1)
Sodium: 136 mmol/L (ref 135–145)

## 2018-01-19 LAB — GLUCOSE, CAPILLARY
GLUCOSE-CAPILLARY: 135 mg/dL — AB (ref 65–99)
GLUCOSE-CAPILLARY: 188 mg/dL — AB (ref 65–99)
GLUCOSE-CAPILLARY: 177 mg/dL — AB (ref 65–99)
GLUCOSE-CAPILLARY: 137 mg/dL — AB (ref 65–99)

## 2018-01-19 LAB — CBC
HEMATOCRIT: 23.1 % — AB (ref 35.0–47.0)
HEMOGLOBIN: 7.9 g/dL — AB (ref 12.0–16.0)
MCH: 34.6 pg — AB (ref 26.0–34.0)
MCHC: 34.4 g/dL (ref 32.0–36.0)
MCV: 100.4 fL — ABNORMAL HIGH (ref 80.0–100.0)
Platelets: 184 10*3/uL (ref 150–440)
RBC: 2.3 MIL/uL — AB (ref 3.80–5.20)
RDW: 13.4 % (ref 11.5–14.5)
WBC: 9.2 10*3/uL (ref 3.6–11.0)

## 2018-01-19 MED ORDER — ENOXAPARIN SODIUM 30 MG/0.3ML ~~LOC~~ SOLN
30.0000 mg | SUBCUTANEOUS | Status: DC
  Administered 2018-01-20: 30 mg via SUBCUTANEOUS
  Filled 2018-01-19: qty 0.3

## 2018-01-19 MED ORDER — ASPIRIN EC 81 MG PO TBEC
81.0000 mg | DELAYED_RELEASE_TABLET | Freq: Every day | ORAL | Status: DC
  Administered 2018-01-19: 81 mg via ORAL
  Filled 2018-01-19: qty 1

## 2018-01-19 NOTE — Progress Notes (Signed)
Physical Therapy Treatment Patient Details Name: Amy NovakDorene Aguirre MRN: [id number] DOB: 11/10/1924 Today's Date: 01/19/2018    History of Present Illness Amy Aguirre is a 84yo white female [facility] after fall at church when child ran into her and knocked her over. PMH: DM2, HTN, [facility] and [facility]. PTA pt lives alone, fully independen tcommunity dweller without assistive device, still drives independent in [place].     PT Comments    Pt agreeable to PT; continues with R thigh soreness with weight bearing. Pt demonstrates mild improvement with STS transfers after initial attempt and verbal and tactile cueing, but continues to use less with a posterior lean to stand requiring Min A. Pt also improved ambulation distance and overall gait sequence quality with re instruction and consistent cueing. Pt demonstrates heavy use of UEs contributing to increased fatigue and need for rest breaks throughout 70 ft walk. Without heavy UE use; however, pt has decreased ability to tolerate weight through RLE. No overt LOB, but pt heavy UE use along with heavy LLE foot strike creates mild unsteadiness. Discussion with daughter regarding home set up, distance to steps to enter home and entrance set up. Daughter notes ability to pull close to entrance way in which pt will have to navigate up 2 steps, walk across small deck and up 1 additional step into the home. Daughter also notes home is small with needed equipment. Daughter states pt can be provided 24/7 care. At this time, pt does require 24 hour assist for mobility for optimal safety. Continue PT to progress strength, endurance to improve all functional mobility and quality.   Follow Up Recommendations        Equipment Recommendations       Recommendations for Other Services       Precautions / Restrictions Precautions Precautions: Fall Restrictions Weight Bearing Restrictions: Yes RLE Weight Bearing:  Weight bearing as tolerated    Mobility  Bed Mobility               General bed mobility comments: Not tested; up in chair and wishes to stay up in chair  Transfers Overall transfer level: Needs assistance Equipment used: Rolling walker (2 wheeled) Transfers: Sit to/from Stand Sit to Stand: Min assist         General transfer comment: cues for safe hand placement and improved use of RLE; improved use with second attempt. Performed several times  Ambulation/Gait Ambulation/Gait assistance: Min guard;Min assist Gait Distance (Feet): 70 Feet Assistive device: Rolling walker (2 wheeled) Gait Pattern/deviations: Step-to pattern;Step-through pattern;Antalgic(heavy use of UEs; mild vaulting) Gait velocity: very slow Gait velocity interpretation: <1.31 ft/sec, indicative of household ambulator General Gait Details: Improved gait sequence with continued cueing/initial assist. Able to progress to step through 3 point sequence with heavy use of UEs and mild vaulting, which is fatiguing to pt; however, with less use of UEs pt has greater difficulty with R weight bearing with L step. Very slow   Stairs             Wheelchair Mobility    Modified Rankin (Stroke Patients Only)       Balance Overall balance assessment: History of Falls;Needs assistance Sitting-balance support: Feet supported;Bilateral upper extremity supported Sitting balance-Leahy Scale: Good     Standing balance support: Bilateral upper extremity supported Standing balance-Leahy Scale: Fair  Cognition Arousal/Alertness: Awake/alert Behavior During Therapy: WFL for tasks assessed/performed Overall Cognitive Status: Within Functional Limits for tasks assessed                                        Exercises Other Exercises Other Exercises: Reviewed ankle pumps, QS, GS, heel slide    General Comments        Pertinent Vitals/Pain Pain  Assessment: Faces Faces Pain Scale: Hurts little more Pain Location: R quad/knee(With weight bearing) Pain Descriptors / Indicators: Aching;Sore Pain Intervention(s): Monitored during session    Home Living                      Prior Function            PT Goals (current goals can now be found in the care plan section) Progress towards PT goals: Progressing toward goals    Frequency    BID      PT Plan Current plan remains appropriate    Co-evaluation              AM-PAC PT "6 Clicks" Daily Activity  Outcome Measure  Difficulty turning over in bed (including adjusting bedclothes, sheets and blankets)?: A Little Difficulty moving from lying on back to sitting on the side of the bed? : Unable Difficulty sitting down on and standing up from a chair with arms (e.g., wheelchair, bedside commode, etc,.)?: Unable Help needed moving to and from a bed to chair (including a wheelchair)?: A Little Help needed walking in hospital room?: A Little Help needed climbing 3-5 steps with a railing? : A Lot 6 Click Score: 13    End of Session Equipment Utilized During Treatment: Gait belt Activity Tolerance: Patient limited by fatigue;Patient limited by pain Patient left: in chair;with call bell/phone within reach;with chair alarm set;with family/visitor present Nurse Communication: Other (comment)(SW regarding discharge) PT Visit Diagnosis: Difficulty in walking, not elsewhere classified (R26.2)     Time: 1610-9604 PT Time Calculation (min) (ACUTE ONLY): 40 min  Charges:  $Gait Training: 23-37 mins $Therapeutic Exercise: 8-22 mins                    G CodesScot Dock, PTA 01/19/2018, 3:59 PM

## 2018-01-19 NOTE — Care Management Note (Signed)
Case Management Note  Patient Details  Name: Amy Aguirre MRN: [id number] Date of Birth: [birth year]  Subjective/Objective:  POD # 2  Right IM nailing. Met with patient at bedside to discuss discharge planning. Patient lives alone. She has a daughter and 2 married grand children that lives close to her that can assist her as needed. She will need a youth walker. She has a bedside commode. Offered a list of home health agencies. [facility]erral to [facility] [place] [facility] [place]. She declined a [facility]. [phone]. Called Lovenox 40 mg # 14 no refills per Dr. Rudene Christians. . Primary nurse updated that patient is ready for discharge once DME has been delivered.                  Action/Plan: [facility] for HHPT/HHOT, [facility] for youth walker, Lovenox called in.   Expected Discharge Date:  01/19/18               Expected Discharge Plan:  [place]  In-House Referral:     Discharge planning Services  CM Consult  Post Acute Care Choice:  Durable Medical Equipment Choice offered to:  Patient  DME Arranged:  Gilford Rile youth DME Agency:  [place] Arranged:  PT, OT Phil [place] Agency:  [facility]  Status of Service:  In process, will continue to follow  If discussed at Long Length of Stay Meetings, dates discussed:    Additional Comments:  Jolly Mango, RN 01/19/2018, 9:44 AM

## 2018-01-19 NOTE — Progress Notes (Signed)
Spoke with Dr. Renae GlossWieting in rounds - Lovenox dose should be reduced from 40 mg subcutaneously once daily to 30 mg subcutaneously once daily due to body weight less than 45 kg and CrCl 20 to 25 mL/min. He says okay to reduce but make sure case manager is aware because the script has already been called in.  Left message on case manager phone to make them aware- Lovenox dose needs to be changed.  Also spoke with Cranston Neighborhris Gaines PA - he is okay with dose change to 30 mg subcutaneously once daily.  No call back received from case manager [facility] [place] and spoke with Cris, RPh - she discontinued Lovenox 40 mg script and will fill Lovenox 30 mg subcutaneously once daily, #14, NR.   Earle Troiano A. [place], [place]arm.D., BCPS Clinical Pharmacist 01/19/18 15:38

## 2018-01-19 NOTE — Care Management Important Message (Signed)
Important Message  Patient Details  Name: Amy Aguirre MRN: [id number] Date of Birth: 08/05/1924   Medicare Important Message Given:  Yes    Olegario MessierKathy A Abdulmalik Darco 01/19/2018, 10:36 AM

## 2018-01-19 NOTE — Care Management (Signed)
[facility]- Deleted Lovenox 40 mg dose.  Lovenox changed to Lovenox 30 mg # 14 no refills. Cost will be $ 71.21. Patient updated.

## 2018-01-19 NOTE — Progress Notes (Signed)
Occupational Therapy Treatment Patient Details Name: Amy Aguirre MRN: [id number] DOB: 12-12-1924 Today's Date: 01/19/2018    History of present illness Amy Aguirre is a 84yo white female who comes to [facility] on 6/21 after fall at church when child ran into her and knocked her over. PMH: DM2, HTN, prior fall in April 2019 with [facility] and cerebellum contusion. PTA pt lives alone, fully independen tcommunity dweller without assistive device, still drives independent in IADL.    OT comments  Pt seen for OT tx this date. Dtr present t/o. Pt/dtr instructed in use of BSC beside bed overnight and falls prevention strategies. Instructed in AE for LB dressing with pt able to return demo use with min assist and dtr able to return demo techniques for donning/doffing compression stockings independently after initial instruction. Provided dtr/pt visual of preferred sock aide for pt (more flexible than hard plastic one trialed during session). Pt/dtr verbalized understanding of instruction provided. Pt progressing well. Still benefits from skilled OT services and HHOT remains appropriate to maximize pt's return to PLOF and independence, minimize risk of future falls, and minimize caregiver burden. Will continue to progress.   Follow Up Recommendations  Home health OT;Supervision - Intermittent    Equipment Recommendations       Recommendations for Other Services      Precautions / Restrictions Precautions Precautions: Fall Restrictions Weight Bearing Restrictions: Yes RLE Weight Bearing: Weight bearing as tolerated       Mobility Bed Mobility               General bed mobility comments: Not tested; up in chair and wishes to stay up in chair  Transfers    Balance Overall balance assessment: History of Falls;Needs assistance Sitting-balance support: Feet supported;Bilateral upper extremity supported Sitting balance-Leahy Scale: Good                               ADL either  performed or assessed with clinical judgement   ADL Overall ADL's : Needs assistance/impaired                     Lower Body Dressing: Minimal assistance;With caregiver independent assisting;Sit to/from stand;With adaptive equipment Lower Body Dressing Details (indicate cue type and reason): Pt/dtr instructed in AE for LB dressing for donning/doffing socks, underwear, and pants. Pt able to return demo with min assist from therapist while using AE. Pt/dtr instructed in compression stocking mgt and wear schedule. Dtr was able to return demo independently and reported confidence in performing for pt once home.   Toilet Transfer Details (indicate cue type and reason): pt/dtr educated in DME for toilet including toilet riser w/ rails vs elevated commode (which pt notes she thought about putting in but might be too high). Encouraged to work with [facility] to determine most suitable option for her.                 Vision Baseline Vision/History: Wears glasses Wears Glasses: At all times Patient Visual Report: No change from baseline     Perception     Praxis      Cognition Arousal/Alertness: Awake/alert Behavior During Therapy: WFL for tasks assessed/performed Overall Cognitive Status: Within Functional Limits for tasks assessed  Exercises Exercises: Other exercises Other Exercises Other Exercises: pt/dtr educated in use of BSC overnight beside bed and strategies for dtr to assist pt in t/f to/from bed<> BSC, instructed in adjusting height and use of rails to improve safety and independence with transfers. Instructed in use of BSC frame overtop commode at home for daytime.    Shoulder Instructions       General Comments      Pertinent Vitals/ Pain       Pain Assessment: Faces Faces Pain Scale: Hurts a little bit Pain Location: R hip/quad Pain Descriptors / Indicators: Aching;Sore Pain Intervention(s): Limited  activity within patient's tolerance;Monitored during session;Repositioned  Home Living                                          Prior Functioning/Environment              Frequency  Min 2X/week        Progress Toward Goals  OT Goals(current goals can now be found in the care plan section)  Progress towards OT goals: Progressing toward goals  Acute Rehab OT Goals Patient Stated Goal: to remain as independent and engage in as many ADLs/IADLs as before OT Goal Formulation: With patient Time For Goal Achievement: 02/01/18 Potential to Achieve Goals: Good  Plan Discharge plan remains appropriate;Frequency remains appropriate    Co-evaluation                 AM-PAC PT "6 Clicks" Daily Activity     Outcome Measure   Help from another person eating meals?: None Help from another person taking care of personal grooming?: None Help from another person toileting, which includes using toliet, bedpan, or urinal?: A Little Help from another person bathing (including washing, rinsing, drying)?: A Lot Help from another person to put on and taking off regular upper body clothing?: None Help from another person to put on and taking off regular lower body clothing?: A Lot 6 Click Score: 19    End of Session    OT Visit Diagnosis: Pain;History of falling (Z91.81);Other abnormalities of gait and mobility (R26.89) Pain - Right/Left: Right Pain - part of body: Hip;Leg   Activity Tolerance Patient tolerated treatment well   Patient Left in chair;with call bell/phone within reach;with family/visitor present   Nurse Communication Other (comment)(need for chair alarm)        Time: [id number] OT Time Calculation (min): 48 min  Charges: OT General Charges $OT Visit: 1 Visit OT Treatments $Self Care/Home Management : 38-52 mins   Richrd PrimeJamie Stiller, MPH, MS, OTR/L ascom [phone] 01/19/18, 5:19 PM

## 2018-01-19 NOTE — Progress Notes (Signed)
Spoke with Dr. Marjie SkiffSridharan pt with a hemoglobin of 7.9.  No new orders at this time.

## 2018-01-19 NOTE — Progress Notes (Signed)
Patient ID: Emberlee Sortino, female   DOB: 08/08/1924, 84 y.o.   MRN: [id number]  [employer] PROGRESS NOTE  Glender Augusta WUJ:[id number] DOB: Mar 19, 1925 DOA: 01/16/2018 PCP: Jaclyn Shaggy, MD  HPI/Subjective: Patient feeling good.  Some right hip soreness.  When I saw her this morning she has not walked yet with physical therapy.  Objective: Vitals:   01/19/18 0804 01/19/18 1027  BP: (!) 143/54   Pulse: 73 72  Resp: 16   Temp: 98.6 F (37 C)   SpO2: 95% 95%    Filed Weights   01/16/18 1937 01/17/18 0508 01/18/18 0653  Weight: 42.6 kg (94 lb) 43.1 kg (95 lb) 40.8 kg (90 lb)    ROS: Review of Systems  Constitutional: Negative for chills and fever.  Eyes: Negative for blurred vision.  Respiratory: Negative for cough and shortness of breath.   Cardiovascular: Negative for chest pain.  Gastrointestinal: Negative for abdominal pain, constipation, diarrhea, nausea and vomiting.  Genitourinary: Negative for dysuria.  Musculoskeletal: Positive for joint pain.  Neurological: Negative for dizziness and headaches.   Exam: Physical Exam  Constitutional: She is oriented to person, place, and time.  HENT:  Nose: No mucosal edema.  Mouth/Throat: No oropharyngeal exudate or posterior oropharyngeal edema.  Eyes: Pupils are equal, round, and reactive to light. Conjunctivae, EOM and lids are normal.  Neck: No JVD present. Carotid bruit is not present. No edema present. No thyroid mass and no thyromegaly present.  Cardiovascular: S1 normal and S2 normal. Exam reveals no gallop.  No murmur heard. Pulses:      Dorsalis pedis pulses are 2+ on the right side, and 2+ on the left side.  Respiratory: No respiratory distress. She has no wheezes. She has no rhonchi. She has no rales.  GI: Soft. Bowel sounds are normal. There is no tenderness.  Musculoskeletal:       Right ankle: She exhibits no swelling.       Left ankle: She exhibits no swelling.  Lymphadenopathy:    She has no cervical  adenopathy.  Neurological: She is alert and oriented to person, place, and time. No cranial nerve deficit.  Sensation intact right lower extremity.  Able to flex and extend right ankle.  Skin: Skin is warm. No rash noted. Nails show no clubbing.  Psychiatric: She has a normal mood and affect.      Data Reviewed: Basic Metabolic Panel: Recent Labs  Lab 01/16/18 2059 01/17/18 0457 01/18/18 0357 01/19/18 0310  NA 136 138 137 136  K 3.8 4.2 4.4 4.0  CL 104 106 106 103  CO2 20* 24 26 25   GLUCOSE 222* 124* 149* 153*  BUN 26* 22* 16 20  CREATININE 1.26* 1.05* 1.01* 1.19*  CALCIUM 9.1 9.5 8.7* 8.6*   CBC: Recent Labs  Lab 01/16/18 2059 01/17/18 0457 01/18/18 0357 01/19/18 0310  WBC 7.3 8.9 6.7 9.2  HGB 10.0* 9.1* 8.5* 7.9*  HCT 29.4* 26.2* 24.6* 23.1*  MCV 101.2* 100.6* 100.3* 100.4*  PLT 230 209 189 184    CBG: Recent Labs  Lab 01/18/18 1206 01/18/18 1645 01/18/18 2108 01/19/18 0758 01/19/18 1223  GLUCAP 167* 101* 180* 135* 188*    Recent Results (from the past 240 hour(s))  MRSA PCR Screening     Status: None   Collection Time: 01/16/18 11:04 PM  Result Value Ref Range Status   MRSA by PCR NEGATIVE NEGATIVE Final    Comment:        The GeneXpert MRSA Assay (FDA approved  for NASAL specimens only), is one component of a comprehensive MRSA colonization surveillance program. It is not intended to diagnose MRSA infection nor to guide or monitor treatment for MRSA infections. Performed at [facility], [address] [place], [place] [address]       Scheduled Meds: . allopurinol  100 mg Oral Daily  . aspirin EC  81 mg Oral QHS  . atenolol  100 mg Oral Daily  . calcium-vitamin D   Oral QHS  . docusate sodium  100 mg Oral BID  . [START ON 01/20/2018] enoxaparin (LOVENOX) injection  30 mg Subcutaneous Q24H  . Ferrous Fumarate   Oral QHS  . insulin aspart  0-5 Units Subcutaneous QHS  . insulin aspart  0-9 Units Subcutaneous TID WC  .  irbesartan  150 mg Oral Daily  . mirtazapine  15 mg Oral QHS  . multivitamin with minerals  1 tablet Oral Daily  . polyethylene glycol  17 g Oral Daily  . potassium chloride  10 mEq Oral Daily  . simvastatin  40 mg Oral Daily   Continuous Infusions: . methocarbamol (ROBAXIN)  IV      Assessment/Plan:  1. Right hip fracture requiring operative repair.  Patient did well with physical therapy.   hopefully will go home tomorrow morning with home health.  Lovenox injections for 14 days called into the pharmacy. 2. Essential hypertension.   Atenolol and irbesartan 3. Hyperlipidemia unspecified on simvastatin 4. Type 2 diabetes mellitus.  On sliding scale for now.  With GFR on the lower side, pharmacy recommends stopping Glucophage.  I am not a big fan of Amaryl so I stopped that also.  Currently on sliding scale.  Will consider Januvia upon going home. 5. History of gout on allopurinol 6. History of chronic anemia.  On iron. 7. Chronic kidney disease stage IV  Code Status:     Code Status Orders  (From admission, onward)        Start     Ordered   01/16/18 2253  Full code  Continuous     01/16/18 2252    Code Status History    Date Active Date Inactive Code Status Order ID Comments User Context   11/10/2017 2319 11/13/2017 1842 Full Code [id number]  Almond LintByerly, Faera, MD Inpatient     Family Communication:  daughter yesterday Disposition Plan:  potentially home with physical therapy and home health on postoperative day 3.  Consultants:  Orthopedic surgery  Time spent: 26 minutes    Alessia Gonsalez [employer]  [employer]

## 2018-01-19 NOTE — Progress Notes (Signed)
Physical Therapy Treatment Patient Details Name: Amy Aguirre MRN: [id number] DOB: [birth year] Today's Date: 01/19/2018    History of Present Illness Amy Aguirre is a 84yo white female who comes to [facility] on 6/21 after fall at church when child ran into her and knocked her over. PMH: DM2, HTN, prior fall in April 2019 with [facility] and cerebellum contusion. PTA pt lives alone, fully independen tcommunity dweller without assistive device, still drives independent in [place]AD[place].     PT Comments    Pt agreeable to PT; denies pain in RLE at rest, but notes 5/10 pain with weight bearing/ambulation in R quad/knee.  Pt requires Min A for bed mobility, transfers and short ambulation this session. Pt demonstrates difficulty with RLE weight bearing with transfers and ambulation. Cues/education for increased use of RLE with transfers and improved gait sequence/steps to decrease stress to RLE and improve body placement in regard to rolling walker , but unable to demonstrate consistently.  Pt participates well in standing and long sit exercises demonstrating good use and range thus far with RLE. Pt reports having 2 STE home and is not demonstrating safe ambulation at this time; concerned for significant fall risk at home. Spoke with SW regarding and will reassess pt function again this afternoon for appropriate discharge recommendation.   Follow Up Recommendations  Other (TBD this afternoon session)     Equipment Recommendations       Recommendations for Other Services       Precautions / Restrictions Precautions Precautions: Fall Restrictions Weight Bearing Restrictions: Yes RLE Weight Bearing: Weight bearing as tolerated    Mobility  Bed Mobility Overal bed mobility: Needs Assistance Bed Mobility: Supine to Sit     Supine to sit: Min assist     General bed mobility comments: Increased time/effort. Demonstrates good movement of RLE to/over edge of bed with assist. Assist for trunk elevation and  balance while scooting to edge.   Transfers Overall transfer level: Needs assistance Equipment used: Rolling walker (2 wheeled) Transfers: Sit to/from Stand Sit to Stand: Min assist         General transfer comment: Cues for safe hand placement; decreased use of RLE  Ambulation/Gait Ambulation/Gait assistance: Min assist Gait Distance (Feet): 15 Feet Assistive device: Rolling walker (2 wheeled) Gait Pattern/deviations: Step-to pattern;Decreased step length - left;Decreased stance time - right;Decreased dorsiflexion - right;Decreased weight shift to right;Antalgic   Gait velocity interpretation: <1.8 ft/sec, indicate of risk for recurrent falls General Gait Details: Pt taking larger R step with toe to heel pattern and stepping too far into rw with posterior lean. Education cues for correction with difficulty correcting consistently requiring Min A for balance and rw placement.    Stairs             Wheelchair Mobility    Modified Rankin (Stroke Patients Only)       Balance Overall balance assessment: History of Falls;Needs assistance Sitting-balance support: Feet supported;Bilateral upper extremity supported Sitting balance-Leahy Scale: Good     Standing balance support: Bilateral upper extremity supported Standing balance-Leahy Scale: Fair                              Cognition Arousal/Alertness: Awake/alert Behavior During Therapy: WFL for tasks assessed/performed Overall Cognitive Status: Within Functional Limits for tasks assessed  Exercises General Exercises - Lower Extremity Ankle Circles/Pumps: AROM;Both;20 reps Quad Sets: Strengthening;Both;20 reps Gluteal Sets: Strengthening;Both;20 reps Long Arc Quad: AROM;Strengthening;Right;10 reps;Seated(2 sets) Hip ABduction/ADduction: AROM;Strengthening;Right;10 reps;Standing(2 sets) Straight Leg Raises: AROM;Strengthening;Right;10  reps;Standing(2 sets) Hip Flexion/Marching: Strengthening;Right;10 reps;Standing(2 sets)    General Comments        Pertinent Vitals/Pain Pain Assessment: 0-10 Pain Score: 5 (With weight bearing RLE; 0 at rest) Pain Location: R quad/knee Pain Intervention(s): Limited activity within patient's tolerance;Monitored during session;Premedicated before session;Repositioned;Ice applied    Home Living                      Prior Function            PT Goals (current goals can now be found in the care plan section) Progress towards PT goals: Progressing toward goals    Frequency    BID      PT Plan Other (comment)(TBD in afternoon session; pt has STE and amb limited)    Co-evaluation              AM-PAC PT "6 Clicks" Daily Activity  Outcome Measure  Difficulty turning over in bed (including adjusting bedclothes, sheets and blankets)?: A Little Difficulty moving from lying on back to sitting on the side of the bed? : Unable Difficulty sitting down on and standing up from a chair with arms (e.g., wheelchair, bedside commode, etc,.)?: Unable Help needed moving to and from a bed to chair (including a wheelchair)?: A Little Help needed walking in hospital room?: A Little Help needed climbing 3-5 steps with a railing? : A Lot 6 Click Score: 13    End of Session Equipment Utilized During Treatment: Gait belt Activity Tolerance: Patient tolerated treatment well Patient left: in chair;with call bell/phone within reach;with chair alarm set   PT Visit Diagnosis: Difficulty in walking, not elsewhere classified (R26.2)     Time: 1027-1100 PT Time Calculation (min) (ACUTE ONLY): 33 min  Charges:  $Gait Training: 8-22 mins $Therapeutic Exercise: 8-22 mins                    G C[place]ot Dock:        Heidi E Barnes, PTA 01/19/2018, 12:03 PM

## 2018-01-19 NOTE — Progress Notes (Addendum)
   Subjective: 2 Days Post-Op Procedure(s) (LRB): INTRAMEDULLARY (IM) NAIL INTERTROCHANTRIC (Right) Patient reports pain as mild.    Patient is well, and has had no acute complaints or problems Denies any CP, SOB, ABD pain. We will continue therapy today.   Objective: Vital signs in last 24 hours: Temp:  [98.3 F (36.8 C)-99.7 F (37.6 C)] 99.7 F (37.6 C) (06/23 2359) Pulse Rate:  [67-81] 79 (06/23 2359) BP: (116-171)/(52-78) 148/63 (06/23 2359) SpO2:  [93 %-100 %] 93 % (06/23 2359)  Intake/Output from previous day: 06/23 0701 - 06/24 0700 In: 600 [P.O.:600] Out: 650 [Urine:650] Intake/Output this shift: No intake/output data recorded.  Recent Labs    01/16/18 2059 01/17/18 0457 01/18/18 0357 01/19/18 0310  HGB 10.0* 9.1* 8.5* 7.9*   Recent Labs    01/18/18 0357 01/19/18 0310  WBC 6.7 9.2  RBC 2.45* 2.30*  HCT 24.6* 23.1*  PLT 189 184   Recent Labs    01/18/18 0357 01/19/18 0310  NA 137 136  K 4.4 4.0  CL 106 103  CO2 26 25  BUN 16 20  CREATININE 1.01* 1.19*  GLUCOSE 149* 153*  CALCIUM 8.7* 8.6*   Recent Labs    01/17/18 0759  INR 1.19    EXAM General - Patient is Alert, Appropriate and Oriented Extremity - Neurovascular intact Sensation intact distally Intact pulses distally Dorsiflexion/Plantar flexion intact No cellulitis present Compartment soft Dressing - dressing C/D/I and scant drainage Motor Function - intact, moving foot and toes well on exam.   Past Medical History:  Diagnosis Date  . Diabetes mellitus without complication (HCC)   . HOH (hard of hearing)   . Hypertension   . PONV (postoperative nausea and vomiting)     Assessment/Plan:   2 Days Post-Op Procedure(s) (LRB): INTRAMEDULLARY (IM) NAIL INTERTROCHANTRIC (Right) Active Problems:   Hip fx (HCC)  Estimated body mass index is 18.18 kg/m as calculated from the following:   Height as of this encounter: 4\' 11"  (1.499 m).   Weight as of this encounter: 40.8 kg (90  lb). Advance diet Up with therapy  Needs BM Acute post op blood loss anemia - Hgb 7.9. Stable. Continue with Iron supplement.  CM to assist with discharge Follow up with [facility] in 2 weeks for staple removal wound check Lovenox 40 mg SQ daily x 14 days after discharge   DVT Prophylaxis - Lovenox, Foot Pumps and TED hose Weight-Bearing as tolerated to right leg   T. Cranston Neighborhris Earle Troiano, PA-C [facility] 01/19/2018, 7:44 AM

## 2018-01-20 LAB — GLUCOSE, CAPILLARY
Glucose-Capillary: 118 mg/dL — ABNORMAL HIGH (ref 70–99)
Glucose-Capillary: 206 mg/dL — ABNORMAL HIGH (ref 70–99)

## 2018-01-20 MED ORDER — LINAGLIPTIN 5 MG PO TABS: 5.0000 mg | ORAL_TABLET | Freq: Every day | ORAL | 0 refills | Status: DC

## 2018-01-20 MED ORDER — HYDROCODONE-ACETAMINOPHEN 5-325 MG PO TABS: 1.0000 | ORAL_TABLET | ORAL | 0 refills | Status: DC | PRN

## 2018-01-20 MED ORDER — ENOXAPARIN SODIUM 30 MG/0.3ML ~~LOC~~ SOLN: 30.0000 mg | SUBCUTANEOUS | 0 refills | Status: DC

## 2018-01-20 MED ORDER — ACETAMINOPHEN 325 MG PO TABS: 650.0000 mg | ORAL_TABLET | Freq: Four times a day (QID) | ORAL | Status: AC | PRN

## 2018-01-20 MED ORDER — DOCUSATE SODIUM 100 MG PO CAPS: 100.0000 mg | ORAL_CAPSULE | Freq: Two times a day (BID) | ORAL | 0 refills | Status: AC | PRN

## 2018-01-20 MED ORDER — LINAGLIPTIN 5 MG PO TABS
5.0000 mg | ORAL_TABLET | Freq: Every day | ORAL | Status: DC
  Administered 2018-01-20: 5 mg via ORAL
  Filled 2018-01-20: qty 1

## 2018-01-20 NOTE — Progress Notes (Signed)
Physical Therapy Treatment Patient Details Name: Amy Aguirre MRN: [id number] DOB: [birth year] Today's Date: 01/20/2018    History of Present Illness Amy Aguirre is a 84yo white female who comes to [facility] on 6/21 after fall at church when child ran into her and knocked her over. PMH: DM2, HTN, prior fall in April 2019 with [place] and cerebellum contusion. PTA pt lives alone, fully independen tcommunity dweller without assistive device, still drives independent in [place].     PT Comments    Ready for session this am.  Stood to walker with min a x 1.  Pt resistant to flexing bilateral knees for proper foot placement.  She was able to ambulate 48' with walker and min assist.  Heavy UE reliance for gait with verbal cues for sequencing.  Fatigues primarily due to UE use.  Pt reports general pain in B knees.  Stair training was initiated and she was able to go up/down 3 steps with bilateral rails and min/mod a x 1.  Limited by pain.  Discussed discharge plan with pt.  She has good support at home and stated she will have 24 hour assist from family.  Ascom number left on board and pt and RN asked for family to call when they are in for training on gait and safety prior to discharge.   Follow Up Recommendations  Home health PT;Supervision for mobility/OOB;Supervision/Assistance - 24 hour     Equipment Recommendations  Rolling walker with 5" wheels    Recommendations for Other Services       Precautions / Restrictions Precautions Precautions: Fall Restrictions Weight Bearing Restrictions: Yes RLE Weight Bearing: Weight bearing as tolerated    Mobility  Bed Mobility               General bed mobility comments: Not tested; up in chair and wishes to stay up in chair  Transfers Overall transfer level: Needs assistance Equipment used: Rolling walker (2 wheeled) Transfers: Sit to/from Stand Sit to Stand: Min assist         General transfer comment: cues for hand placements and to bend  knees for proper foot placement  Ambulation/Gait Ambulation/Gait assistance: Min guard;Min assist Gait Distance (Feet): 60 Feet Assistive device: Rolling walker (2 wheeled) Gait Pattern/deviations: Step-to pattern;Step-through pattern;Antalgic Gait velocity: very slow Gait velocity interpretation: <1.31 ft/sec, indicative of household ambulator General Gait Details: verbal cues for gait sequence, heavy use of UE's for gait which cause arm fatigue.  Vaulting continues.  Slow gait, requires +1 assist.   Stairs Stairs: Yes Stairs assistance: Min assist;Mod assist Stair Management: Two rails Number of Stairs: 33 General stair comments: +1-+2 assist.  Heavy verbal cues.   Wheelchair Mobility    Modified Rankin (Stroke Patients Only)       Balance Overall balance assessment: History of Falls;Needs assistance Sitting-balance support: Feet supported;Bilateral upper extremity supported Sitting balance-Leahy Scale: Good     Standing balance support: Bilateral upper extremity supported Standing balance-Leahy Scale: Fair                              Cognition Arousal/Alertness: Awake/alert Behavior During Therapy: WFL for tasks assessed/performed Overall Cognitive Status: Within Functional Limits for tasks assessed                                        Exercises Other Exercises Other Exercises:  seated AAROM with empahsis on bilateral knee flexion.  C/o general discomfort bilaterally.    General Comments        Pertinent Vitals/Pain Pain Assessment: Faces Faces Pain Scale: Hurts little more Pain Location: R hip/quad Pain Descriptors / Indicators: Aching;Sore Pain Intervention(s): Limited activity within patient's tolerance;Monitored during session    Home Living                      Prior Function            PT Goals (current goals can now be found in the care plan section) Progress towards PT goals: Progressing toward  goals    Frequency    BID      PT Plan Current plan remains appropriate    Co-evaluation              AM-PAC PT "6 Clicks" Daily Activity  Outcome Measure  Difficulty turning over in bed (including adjusting bedclothes, sheets and blankets)?: A Little Difficulty moving from lying on back to sitting on the side of the bed? : Unable Difficulty sitting down on and standing up from a chair with arms (e.g., wheelchair, bedside commode, etc,.)?: Unable Help needed moving to and from a bed to chair (including a wheelchair)?: A Little Help needed walking in hospital room?: A Little Help needed climbing 3-5 steps with a railing? : A Lot 6 Click Score: 13    End of Session Equipment Utilized During Treatment: Gait belt Activity Tolerance: Patient limited by fatigue;Patient limited by pain Patient left: in chair;with call bell/phone within reach;with chair alarm set;with family/visitor present Nurse Communication: Other (comment)       Time: 1610-9604: 0927-0956 PT Time Calculation (min) (ACUTE ONLY): 29 min  Charges:  $Gait Training: 8-22 mins $Therapeutic Exercise: 8-22 mins                    G Codes:       Danielle DessSarah Tyrome Donatelli, PTA 01/20/18, 10:29 AM

## 2018-01-20 NOTE — Progress Notes (Signed)
Pt in no acute distress. Discharge education explained to pt and family, all verbalized understanding. VSS. IV removed. Pt wheeled to visitors entrance and assisted into daughters vehicle.

## 2018-01-20 NOTE — Progress Notes (Signed)
   Patient Details Name: Amy NovakDorene Aguirre MRN: [id number] DOB: 06/23/1925   Family in for discharge instructions.  Pt declined further gait and interventions.  Family in and reported feeling comfortable with discharge plan.  Reviewed gait and stair safety with family.  Encouraged use of gait belt and +1 assist at all times with mobility.  They had no further questions or concerns at this time.   Danielle DessSarah Thos Matsumoto 01/20/2018, 12:45 PM

## 2018-01-20 NOTE — Progress Notes (Signed)
   Subjective: 3 Days Post-Op Procedure(s) (LRB): INTRAMEDULLARY (IM) NAIL INTERTROCHANTRIC (Right) Patient reports pain as mild.    Patient is well, and has had no acute complaints or problems Denies any CP, SOB, ABD pain. We will continue therapy today.   Objective: Vital signs in last 24 hours: Temp:  [98.6 F (37 C)-98.8 F (37.1 C)] 98.6 F (37 C) (06/25 0026) Pulse Rate:  [72-73] 72 (06/25 0026) Resp:  [16-19] 19 (06/25 0026) BP: (105-143)/(50-56) 120/50 (06/25 0026) SpO2:  [95 %-100 %] 97 % (06/25 0026) Weight:  [43.1 kg (95 lb)] 43.1 kg (95 lb) (06/25 0500)  Intake/Output from previous day: 06/24 0701 - 06/25 0700 In: 360 [P.O.:360] Out: -  Intake/Output this shift: No intake/output data recorded.  Recent Labs    01/18/18 0357 01/19/18 0310  HGB 8.5* 7.9*   Recent Labs    01/18/18 0357 01/19/18 0310  WBC 6.7 9.2  RBC 2.45* 2.30*  HCT 24.6* 23.1*  PLT 189 184   Recent Labs    01/18/18 0357 01/19/18 0310  NA 137 136  K 4.4 4.0  CL 106 103  CO2 26 25  BUN 16 20  CREATININE 1.01* 1.19*  GLUCOSE 149* 153*  CALCIUM 8.7* 8.6*   Recent Labs    01/17/18 0759  INR 1.19    EXAM General - Patient is Alert, Appropriate and Oriented Extremity - Neurovascular intact Sensation intact distally Intact pulses distally Dorsiflexion/Plantar flexion intact No cellulitis present Compartment soft Dressing - dressing C/D/I and scant drainage Motor Function - intact, moving foot and toes well on exam.   Past Medical History:  Diagnosis Date  . Diabetes mellitus without complication (HCC)   . HOH (hard of hearing)   . Hypertension   . PONV (postoperative nausea and vomiting)     Assessment/Plan:   3 Days Post-Op Procedure(s) (LRB): INTRAMEDULLARY (IM) NAIL INTERTROCHANTRIC (Right) Active Problems:   Hip fx (HCC)  Estimated body mass index is 19.19 kg/m as calculated from the following:   Height as of this encounter: 4\' 11"  (1.499 m).   Weight as  of this encounter: 43.1 kg (95 lb). Advance diet Up with therapy  Acute post op blood loss anemia - Hgb 7.9. Stable. Labs pending for this am. Continue with Iron supplement.  CM to assist with discharge Follow up with [facility] in 2 weeks for staple removal wound check Lovenox 40 mg SQ daily x 14 days after discharge   DVT Prophylaxis - Lovenox, Foot Pumps and TED hose Weight-Bearing as tolerated to right leg   T. Cranston Neighborhris Gaines, PA-C [facility] 01/20/2018, 7:08 AM

## 2018-01-20 NOTE — Care Management Note (Signed)
Case Management Note  Patient Details  Name: Conard NovakDorene Koop MRN: [id number] Date of Birth: 03/27/1925  Subjective/Objective:  Discharging today                  Action/Plan: [facility] made aware of discharge.   Expected Discharge Date:  01/20/18               Expected Discharge Plan:  Home w Home Health Services  In-House Referral:     Discharge planning Services  CM Consult  Post Acute Care Choice:  Durable Medical Equipment Choice offered to:  Patient  DME Arranged:  Dan HumphreysWalker youth DME Agency:  [facility]  HH Arranged:  PT, OT HH [facility]  [facility]  Status of Service:  Completed, signed off  If discussed at [employer] Meetings, dates discussed:    Additional Comments:  Marily MemosLisa M Yarisa Lynam, RN 01/20/2018, 8:55 AM

## 2018-01-20 NOTE — Discharge Instructions (Addendum)
Glipizide and glucophage stopped.  Prescribe Tradjenta for diabetes.  Hip Fracture A hip fracture is a fracture of the upper part of your thigh bone (femur). What are the causes? A hip fracture is caused by a direct blow to the side of your hip. This is usually the result of a fall but can occur in other circumstances, such as an automobile accident. What increases the risk? There is an increased risk of hip fractures in people with:  An unsteady walking pattern (gait) and those with conditions that contribute to poor balance, such as Parkinson's disease or dementia.  Osteopenia and osteoporosis.  Cancer that spreads to the leg bones.  Certain metabolic diseases.  What are the signs or symptoms? Symptoms of hip fracture include:  Pain over the injured hip.  Inability to put weight on the leg in which the fracture occurred (although, some patients are able to walk after a hip fracture).  Toes and foot of the affected leg point outward when you lie down.  How is this diagnosed? A physical exam can determine if a hip fracture is likely to have occurred. X-ray exams are needed to confirm the fracture and to look for other injuries. The X-ray exam can help to determine the type of hip fracture. Rarely, the fracture is not visible on an X-ray image and a CT scan or MRI will have to be done. How is this treated? The treatment for a fracture is usually surgery. This means using a screw, nail, or rod to hold the bones in place. Follow these instructions at home: Take all medicines as directed by your health care provider. Contact a health care provider if: Pain continues, even after taking pain medicine. This information is not intended to replace advice given to you by your health care provider. Make sure you discuss any questions you have with your health care provider. Document Released: 07/15/2005 Document Revised: 12/21/2015 Document Reviewed: 02/24/2013 Elsevier Interactive Patient  Education  2017 Elsevier Inc. Diet: As you were doing prior to hospitalization   Shower:  May shower but keep the wounds dry, use an occlusive plastic wrap, NO SOAKING IN TUB.  If the bandage gets wet, change with a clean dry gauze.  Dressing:  You may change your dressing as needed. Change the dressing with sterile gauze dressing.    Activity:  Increase activity slowly as tolerated, but follow the weight bearing instructions below.  No lifting or driving for 6 weeks.  Weight Bearing:   Weight bearing as tolerated to right lower extremity  To prevent constipation: you may use a stool softener such as -  Colace (over the counter) 100 mg by mouth twice a day  Drink plenty of fluids (prune juice may be helpful) and high fiber foods Miralax (over the counter) for constipation as needed.    Itching:  If you experience itching with your medications, try taking only a single pain pill, or even half a pain pill at a time.  You may take up to 10 pain pills per day, and you can also use benadryl over the counter for itching or also to help with sleep.   Precautions:  If you experience chest pain or shortness of breath - call 911 immediately for transfer to the hospital emergency department!!  If you develop a fever greater that 101 F, purulent drainage from wound, increased redness or drainage from wound, or calf pain-Call [employer]  Follow- Up Appointment:  Please call for an appointment to be [facility]

## 2018-01-20 NOTE — Discharge Summary (Signed)
[facility] - [place] [facility]   PATIENT NAME: Conard NovakDorene Dortch    MR#:  [id number]  DATE OF BIRTH:  06/11/1925  DATE OF ADMISSION:  01/16/2018 ADMITTING PHYSICIAN: Cammy CopaAngela Maier, MD  DATE OF DISCHARGE: 01/20/2018  1:00 PM  PRIMARY CARE PHYSICIAN: Jaclyn Shaggyate, Denny C, MD    ADMISSION DIAGNOSIS:  Fall, initial encounter [id number][W19.XXXA] Closed fracture of right hip, initial encounter (HCC) [S72.001A]  DISCHARGE DIAGNOSIS:  Active Problems:   Hip fx (HCC)   SECONDARY DIAGNOSIS:   Past Medical History:  Diagnosis Date  . Diabetes mellitus without complication (HCC)   . HOH (hard of hearing)   . Hypertension   . PONV (postoperative nausea and vomiting)     HOSPITAL COURSE:   1.  Right hip fracture requiring operative care.  The patient did very well with physical therapy.  Physical therapy recommended home with home health.  Home health set up.  Patient has a very supportive family.  Lovenox injections recommended by orthopedic surgery for 14 days.  Nursing staff to teach patient and family had to inject the Lovenox. 2.  Essential hypertension on atenolol and irbesartan 3.  Hyperlipidemia unspecified on simvastatin 4.  Type 2 diabetes mellitus.  With GFR on the lower side pharmacy recommend stopping the Glucophage.  Also Amaryl not a good medication.  I will start low-dose Tradjenta 5 mg daily 5.  History of gout on allopurinol 6.  History of chronic anemia on iron. Recommend checking hemoglobin and follow-up appointment. 7.  Chronic kidney disease stage III with GFR 39    DISCHARGE CONDITIONS:  Satisfactory  CONSULTS OBTAINED:  Treatment Team:  Kennedy BuckerMenz, Michael, MD  DRUG ALLERGIES:  No Known Allergies  DISCHARGE MEDICATIONS:   Allergies as of 01/20/2018   No Known Allergies     Medication List    STOP taking these medications   diphenhydramine-acetaminophen 25-500 MG Tabs tablet Commonly known as:  TYLENOL PM   glimepiride 1 MG tablet Commonly known as:   AMARYL   indapamide 2.5 MG tablet Commonly known as:  LOZOL   indomethacin 25 MG capsule Commonly known as:  INDOCIN   metFORMIN 500 MG tablet Commonly known as:  GLUCOPHAGE   potassium chloride 10 MEQ tablet Commonly known as:  K-DUR,KLOR-CON     TAKE these medications   acetaminophen 325 MG tablet Commonly known as:  TYLENOL Take 2 tablets (650 mg total) by mouth every 6 (six) hours as needed for mild pain (or Fever >/= 101).   ALLOPURINOL PO Take 1 tablet by mouth as needed.   aspirin EC 81 MG tablet Take 81 mg by mouth at bedtime.   atenolol 100 MG tablet Commonly known as:  TENORMIN Take 100 mg by mouth daily.   CALCIUM+D3 PO Take 1 tablet by mouth at bedtime. Oscal   docusate sodium 100 MG capsule Commonly known as:  COLACE Take 1 capsule (100 mg total) by mouth 2 (two) times daily as needed for moderate constipation.   enoxaparin 30 MG/0.3ML injection Commonly known as:  LOVENOX Inject 0.3 mLs (30 mg total) into the skin daily for 14 days.   HYDROcodone-acetaminophen 5-325 MG tablet Commonly known as:  NORCO/VICODIN Take 1 tablet by mouth every 4 (four) hours as needed for moderate pain (pain score 4-6).   irbesartan 150 MG tablet Commonly known as:  AVAPRO Take 150 mg by mouth daily.   IRON PO Take 1 tablet by mouth at bedtime.   linagliptin 5 MG Tabs tablet Commonly known  as:  TRADJENTA Take 1 tablet (5 mg total) by mouth daily.   mirtazapine 15 MG tablet Commonly known as:  REMERON Take 15 mg by mouth at bedtime.   multivitamin with minerals Tabs tablet Take 1 tablet by mouth daily. Centrum Silver   simvastatin 40 MG tablet Commonly known as:  ZOCOR Take 40 mg by mouth daily.            Durable Medical Equipment  (From admission, onward)        Start     Ordered   01/20/18 1129  For home use only DME Bedside commode  Once    Question:  Patient needs a bedside commode to treat with the following condition  Answer:  Weakness    01/20/18 1128   01/19/18 1208  For home use only DME Walker youth  Once    Comments:  rolling  Question:  Patient needs a walker to treat with the following condition  Answer:  Weakness   01/19/18 1212       DISCHARGE INSTRUCTIONS:   Follow-up PMD 6 days Follow-up with Dr. Rosita Kea 2 weeks  If you experience worsening of your admission symptoms, develop shortness of breath, life threatening emergency, suicidal or homicidal thoughts you must seek medical attention immediately by calling 911 or calling your MD immediately  if symptoms less severe.  You Must read complete instructions/literature along with all the possible adverse reactions/side effects for all the Medicines you take and that have been prescribed to you. Take any new Medicines after you have completely understood and accept all the possible adverse reactions/side effects.   Please note  You were cared for by a hospitalist during your hospital stay. If you have any questions about your discharge medications or the care you received while you were in the hospital after you are discharged, you can call the unit and asked to speak with the hospitalist on call if the hospitalist that took care of you is not available. Once you are discharged, your primary care physician will handle any further medical issues. Please note that NO REFILLS for any discharge medications will be authorized once you are discharged, as it is imperative that you return to your primary care physician (or establish a relationship with a primary care physician if you do not have one) for your aftercare needs so that they can reassess your need for medications and monitor your lab values.    Today   CHIEF COMPLAINT:   Chief Complaint  Patient presents with  . Fall    HISTORY OF PRESENT ILLNESS:  Annalia Metzger  is a 84 y.o. female was accidentally knocked over and found to have a broken hip   VITAL SIGNS:  Blood pressure (!) 125/112, pulse (!) 112,  temperature 98.4 F (36.9 C), temperature source Oral, resp. rate 16, height 4\' 11"  (1.499 m), weight 43.1 kg (95 lb), SpO2 100 %.   Blood pressure before this 145/59 with a pulse of 73.   PHYSICAL EXAMINATION:  GENERAL:  84 y.o.-year-old patient lying in the bed with no acute distress.  EYES: Pupils equal, round, reactive to light and accommodation. No scleral icterus. Extraocular muscles intact.  HEENT: Head atraumatic, normocephalic. Oropharynx and nasopharynx clear.  NECK:  Supple, no jugular venous distention. No thyroid enlargement, no tenderness.  LUNGS: Normal breath sounds bilaterally, no wheezing, rales,rhonchi or crepitation. No use of accessory muscles of respiration.  CARDIOVASCULAR: S1, S2 normal. No murmurs, rubs, or gallops.  ABDOMEN: Soft, non-tender, non-distended.  Bowel sounds present. No organomegaly or mass.  EXTREMITIES: No pedal edema, cyanosis, or clubbing.  NEUROLOGIC: Cranial nerves II through XII are intact. Muscle strength 5/5 in all extremities. Sensation intact. Gait not checked.  PSYCHIATRIC: The patient is alert and oriented x 3.  SKIN: No obvious rash, lesion, or ulcer.   DATA REVIEW:   CBC Recent Labs  Lab 01/19/18 0310  WBC 9.2  HGB 7.9*  HCT 23.1*  PLT 184    Chemistries  Recent Labs  Lab 01/19/18 0310  NA 136  K 4.0  CL 103  CO2 25  GLUCOSE 153*  BUN 20  CREATININE 1.19*  CALCIUM 8.6*     Microbiology Results  Results for orders placed or performed during the hospital encounter of 01/16/18  MRSA PCR Screening     Status: None   Collection Time: 01/16/18 11:04 PM  Result Value Ref Range Status   MRSA by PCR NEGATIVE NEGATIVE Final    Comment:        The GeneXpert MRSA Assay (FDA approved for NASAL specimens only), is one component of a comprehensive MRSA colonization surveillance program. It is not intended to diagnose MRSA infection nor to guide or monitor treatment for MRSA infections. Performed at [facility], [address], [place], [place] [address]    Recommend checking a hemoglobin and follow-up appointment.   Management plans discussed with the patient, family and they are in agreement.  CODE STATUS:     Code Status Orders  (From admission, onward)        Start     Ordered   01/16/18 2253  Full code  Continuous     01/16/18 2252    Code Status History    Date Active Date Inactive Code Status Order ID Comments User Context   11/10/2017 2319 11/13/2017 1842 Full Code [id number]  Almond Lint, MD Inpatient      TOTAL TIME TAKING CARE OF THIS PATIENT: 35 minutes.    Alford Highland M.D on 01/20/2018 at 1:45 PM  Between 7am to 6pm - Pager - [phone]  After 6pm go to [email or web address] - password [email or web address]  [facility] Office  [phone]  CC: Primary care physician; Jaclyn Shaggy, MD

## 2018-01-21 ENCOUNTER — Encounter: Payer: Medicare Other | Admitting: Physical Therapy

## 2018-01-21 ENCOUNTER — Encounter: Payer: Medicare Other | Admitting: Occupational Therapy

## 2018-01-26 ENCOUNTER — Telehealth: Payer: Self-pay | Admitting: Licensed Clinical Social Worker

## 2018-01-26 NOTE — Telephone Encounter (Signed)
EMMI flagged patient for answering yes to feeling sad/hopeless/anxious/empty. Clinical Social Worker (CSW) contacted patient and her daughter Amy Hashimotoatricia answered the phone. Per daughter patient is taking a nap right now and can call CSW back later. Per daughter she and her sister have been rotating staying with patient since she left [facility]. Per daughter patient has been doing good and has remained mobile with home health PT. Per daughter she was with patient when she answered the EMMI call and patient was having a hard time hearing the questions. Per daughter patient is not feeling depressed or sad. Per daughter she will ask patient to call CSW back when available.   [employer]Bailey Rawson [employer], LCSW [phone]

## 2018-01-27 NOTE — Telephone Encounter (Signed)
Patient called Clinical Social Worker (CSW) back on Monday 01/26/18 at 5:30 pm. Per patient she is doing good and has remained mobile. Patient reported that she is not depressed and did not mean to answer yes to that question. Patient reported no needs or concerns.   [employer]Bailey Nathaniel Wakeley, LCSW [phone]

## 2019-07-15 ENCOUNTER — Other Ambulatory Visit: Payer: Self-pay

## 2019-07-15 ENCOUNTER — Encounter: Payer: Self-pay | Admitting: *Deleted

## 2019-07-15 ENCOUNTER — Emergency Department: Payer: Medicare Other

## 2019-07-15 ENCOUNTER — Inpatient Hospital Stay
Admission: EM | Admit: 2019-07-15 | Discharge: 2019-07-17 | DRG: 066 | Disposition: A | Discharge status: Home / Self Care | Payer: Medicare Other | Attending: Internal Medicine | Admitting: Internal Medicine

## 2019-07-15 DIAGNOSIS — Z7982 Long term (current) use of aspirin: Secondary | ICD-10-CM

## 2019-07-15 DIAGNOSIS — Z7984 Long term (current) use of oral hypoglycemic drugs: Secondary | ICD-10-CM

## 2019-07-15 DIAGNOSIS — I160 Hypertensive urgency: Secondary | ICD-10-CM | POA: Diagnosis not present

## 2019-07-15 DIAGNOSIS — I639 Cerebral infarction, unspecified: Principal | ICD-10-CM | POA: Diagnosis present

## 2019-07-15 DIAGNOSIS — Z79899 Other long term (current) drug therapy: Secondary | ICD-10-CM

## 2019-07-15 DIAGNOSIS — R4781 Slurred speech: Secondary | ICD-10-CM

## 2019-07-15 DIAGNOSIS — G459 Transient cerebral ischemic attack, unspecified: Secondary | ICD-10-CM

## 2019-07-15 DIAGNOSIS — R4702 Dysphasia: Secondary | ICD-10-CM | POA: Diagnosis present

## 2019-07-15 DIAGNOSIS — E785 Hyperlipidemia, unspecified: Secondary | ICD-10-CM

## 2019-07-15 DIAGNOSIS — Z833 Family history of diabetes mellitus: Secondary | ICD-10-CM

## 2019-07-15 DIAGNOSIS — M109 Gout, unspecified: Secondary | ICD-10-CM | POA: Diagnosis present

## 2019-07-15 DIAGNOSIS — Z20828 Contact with and (suspected) exposure to other viral communicable diseases: Secondary | ICD-10-CM | POA: Diagnosis present

## 2019-07-15 DIAGNOSIS — R471 Dysarthria and anarthria: Secondary | ICD-10-CM | POA: Diagnosis not present

## 2019-07-15 DIAGNOSIS — I10 Essential (primary) hypertension: Secondary | ICD-10-CM | POA: Diagnosis present

## 2019-07-15 DIAGNOSIS — R29701 NIHSS score 1: Secondary | ICD-10-CM | POA: Diagnosis present

## 2019-07-15 DIAGNOSIS — E119 Type 2 diabetes mellitus without complications: Secondary | ICD-10-CM | POA: Diagnosis present

## 2019-07-15 DIAGNOSIS — E440 Moderate protein-calorie malnutrition: Secondary | ICD-10-CM | POA: Insufficient documentation

## 2019-07-15 LAB — COMPREHENSIVE METABOLIC PANEL
ALT: 17 U/L (ref 0–44)
AST: 33 U/L (ref 15–41)
Albumin: 3.7 g/dL (ref 3.5–5.0)
Alkaline Phosphatase: 54 U/L (ref 38–126)
Anion gap: 15 (ref 5–15)
BUN: 18 mg/dL (ref 8–23)
CO2: 20 mmol/L — ABNORMAL LOW (ref 22–32)
Calcium: 9 mg/dL (ref 8.9–10.3)
Chloride: 103 mmol/L (ref 98–111)
Creatinine, Ser: 1.14 mg/dL — ABNORMAL HIGH (ref 0.44–1.00)
GFR calc Af Amer: 48 mL/min — ABNORMAL LOW (ref >60)
GFR calc non Af Amer: 42 mL/min — ABNORMAL LOW (ref >60)
Glucose, Bld: 211 mg/dL — ABNORMAL HIGH (ref 70–99)
Potassium: 4.1 mmol/L (ref 3.5–5.1)
Sodium: 138 mmol/L (ref 135–145)
Total Bilirubin: 0.8 mg/dL (ref 0.3–1.2)
Total Protein: 7 g/dL (ref 6.5–8.1)

## 2019-07-15 LAB — CBC
HCT: 31 % — ABNORMAL LOW (ref 36.0–46.0)
Hemoglobin: 10.7 g/dL — ABNORMAL LOW (ref 12.0–15.0)
MCH: 33.2 pg (ref 26.0–34.0)
MCHC: 34.5 g/dL (ref 30.0–36.0)
MCV: 96.3 fL (ref 80.0–100.0)
Platelets: 209 10*3/uL (ref 150–400)
RBC: 3.22 MIL/uL — ABNORMAL LOW (ref 3.87–5.11)
RDW: 13.2 % (ref 11.5–15.5)
WBC: 7.7 10*3/uL (ref 4.0–10.5)
nRBC: 0 % (ref 0.0–0.2)

## 2019-07-15 LAB — DIFFERENTIAL
Abs Immature Granulocytes: 0.03 10*3/uL (ref 0.00–0.07)
Basophils Absolute: 0 10*3/uL (ref 0.0–0.1)
Basophils Relative: 0 %
Eosinophils Absolute: 0.2 10*3/uL (ref 0.0–0.5)
Eosinophils Relative: 3 %
Immature Granulocytes: 0 %
Lymphocytes Relative: 22 %
Lymphs Abs: 1.7 10*3/uL (ref 0.7–4.0)
Monocytes Absolute: 0.5 10*3/uL (ref 0.1–1.0)
Monocytes Relative: 7 %
Neutro Abs: 5.3 10*3/uL (ref 1.7–7.7)
Neutrophils Relative %: 68 %

## 2019-07-15 LAB — PROTIME-INR
INR: 1.1 (ref 0.8–1.2)
Prothrombin Time: 14 seconds (ref 11.4–15.2)

## 2019-07-15 LAB — GLUCOSE, CAPILLARY: Glucose-Capillary: 203 mg/dL — ABNORMAL HIGH (ref 70–99)

## 2019-07-15 LAB — APTT: aPTT: 28 seconds (ref 24–36)

## 2019-07-15 MED ORDER — ASPIRIN 81 MG PO CHEW: 81.0000 mg | CHEWABLE_TABLET | Freq: Once | ORAL | Status: DC

## 2019-07-15 NOTE — H&P (Signed)
[name] at [facility]   PATIENT NAME: Amy Aguirre    MR#:  [id number]  DATE OF BIRTH:  11/18/1924  DATE OF ADMISSION:  07/15/2019  PRIMARY CARE PHYSICIAN: Jaclyn Shaggyate, Denny C, MD   REQUESTING/REFERRING PHYSICIAN: Paschal DoppMonks, Sarah, MD CHIEF COMPLAINT:   Chief Complaint  Patient presents with  . Aphasia  And slurred speech  HISTORY OF PRESENT ILLNESS:  Amy Aguirre  is a 85 y.o. pleasant Caucasian female with a known history of type diabetes mellitus and hypertension, who presented to the emergency room with acute onset of slurred speech with expressive dysphasia.  She was last normal at 4:30 PM and 100-hour talk to her over the phone at 8 PM she was having slurred speech and difficulty getting words out.  She denied any headache or dizziness or blurred vision.  No paresthesias or focal muscle weakness.  No fever or chills.  No chest pain or palpitations.  No tinnitus or vertigo no dyspnea or cough or wheezing or recent sick exposure to COVID-19.  She takes her baby aspirin regularly.  Upon presentation to the emergency room, vital signs were within normal and later blood pressure was up to 215/100.  Labs revealed a blood glucose of 211 and anemia better than previous levels.  COVID-19 test is currently pending.  Noncontrasted head CT scan showed age-related atrophy and chronic small vessel ischemia with no acute intracranial abnormalities.  EKG showed normal sinus rhythm with rate of 81.  The patient was given 81 mg p.o. aspirin.  She will be admitted to an observation medically monitored bed for further evaluation and management. PAST MEDICAL HISTORY:   Past Medical History:  Diagnosis Date  . Diabetes mellitus without complication (HCC)   . HOH (hard of hearing)   . Hypertension   . PONV (postoperative nausea and vomiting)     PAST SURGICAL HISTORY:   Past Surgical History:  Procedure Laterality Date  . ABDOMINAL HYSTERECTOMY    . CATARACT EXTRACTION W/PHACO Right  04/18/2015   Procedure: CATARACT EXTRACTION PHACO AND INTRAOCULAR LENS PLACEMENT (IOC);  Surgeon: Galen ManilaWilliam Porfilio, MD;  Location: [facility] ORS;  Service: Ophthalmology;  Laterality: Right;  [place]   00:58.9 AP    24.1 CDE   14.19 casette lot#  . CHOLECYSTECTOMY    . GANGLION CYST EXCISION    . INTRAMEDULLARY (IM) NAIL INTERTROCHANTERIC Right 01/17/2018   Procedure: INTRAMEDULLARY (IM) NAIL INTERTROCHANTRIC;  Surgeon: Kennedy BuckerMenz, Michael, MD;  Location: [facility] ORS;  Service: Orthopedics;  Laterality: Right;    SOCIAL HISTORY:   Social History   Tobacco Use  . Smoking status: Never Smoker  . Smokeless tobacco: Never Used  Substance Use Topics  . Alcohol use: No    FAMILY HISTORY:  Positive for throat cancer in her father and diabetes mellitus in her son and daughter.  DRUG ALLERGIES:  No Known Allergies  REVIEW OF SYSTEMS:   ROS As per history of present illness. All pertinent systems were reviewed above. Constitutional,  HEENT, cardiovascular, respiratory, GI, GU, musculoskeletal, neuro, psychiatric, endocrine,  integumentary and hematologic systems were reviewed and are otherwise  negative/unremarkable except for positive findings mentioned above in the HPI.   MEDICATIONS AT HOME:   Prior to Admission medications   Medication Sig Start Date End Date Taking? Authorizing Provider  acetaminophen (TYLENOL) 325 MG tablet Take 2 tablets (650 mg total) by mouth every 6 (six) hours as needed for mild pain (or Fever >/= 101). 01/20/18  Yes Alford HighlandWieting, Richard, MD  allopurinol (ZYLOPRIM)  100 MG tablet Take 100 mg by mouth daily. 06/03/19  Yes [provider]  aspirin EC 81 MG tablet Take 81 mg by mouth at bedtime.   Yes [provider]  atenolol (TENORMIN) 100 MG tablet Take 100 mg by mouth daily.   Yes [provider]  Calcium Carb-Cholecalciferol (CALCIUM+D3 PO) Take 1 tablet by mouth at bedtime. Oscal   Yes [provider]  glipiZIDE (GLUCOTROL) 5 MG tablet Take  0.5 tablets by mouth daily. 04/25/19  Yes [provider]  irbesartan (AVAPRO) 150 MG tablet Take 150 mg by mouth daily. 10/17/17  Yes [provider]  metFORMIN (GLUCOPHAGE) 500 MG tablet Take 1,000 mg by mouth 2 (two) times daily. 04/29/19  Yes [provider]  Multiple Vitamin (MULTIVITAMIN WITH MINERALS) TABS tablet Take 1 tablet by mouth daily. Centrum Silver   Yes [provider]  potassium chloride (KLOR-CON 10) 10 MEQ tablet Take 10 mEq by mouth daily.   Yes [provider]  simvastatin (ZOCOR) 40 MG tablet Take 40 mg by mouth daily.   Yes [provider]  cefUROXime (CEFTIN) 250 MG tablet Take 250 mg by mouth 2 (two) times daily. 06/16/19   [provider]  docusate sodium (COLACE) 100 MG capsule Take 1 capsule (100 mg total) by mouth 2 (two) times daily as needed for moderate constipation. 01/20/18   Alford Highland, MD      VITAL SIGNS:  Blood pressure (!) 212/68, pulse 71, temperature 98.3 F (36.8 C), temperature source Oral, resp. rate (!) 21, height 5\' 2"  (1.575 m), weight 42.2 kg, SpO2 99 %.  PHYSICAL EXAMINATION:  Physical Exam  GENERAL:  85 y.o.-year-old pleasant Caucasian female patient lying in the bed with no acute distress.  EYES: Pupils equal, round, reactive to light and accommodation. No scleral icterus. Extraocular muscles intact.  HEENT: Head atraumatic, normocephalic. Oropharynx and nasopharynx clear.  NECK:  Supple, no jugular venous distention. No thyroid enlargement, no tenderness.  LUNGS: Normal breath sounds bilaterally, no wheezing, rales,rhonchi or crepitation. No use of accessory muscles of respiration.  CARDIOVASCULAR: Regular rate and rhythm, S1, S2 normal. No murmurs, rubs, or gallops.  ABDOMEN: Soft, nondistended, nontender. Bowel sounds present. No organomegaly or mass.  EXTREMITIES: No pedal edema, cyanosis, or clubbing.  NEUROLOGIC: Cranial nerves II through XII are intact. Muscle strength  5/5 in all extremities. Sensation intact. Gait not checked.  Speech is currently almost back to normal per her daughter. PSYCHIATRIC: The patient is alert and oriented x 3.  Normal affect and good eye contact. SKIN: No obvious rash, lesion, or ulcer.   LABORATORY PANEL:   CBC Recent Labs  Lab 07/15/19 2144  WBC 7.7  HGB 10.7*  HCT 31.0*  PLT 209   ------------------------------------------------------------------------------------------------------------------  Chemistries  Recent Labs  Lab 07/15/19 2144  NA 138  K 4.1  CL 103  CO2 20*  GLUCOSE 211*  BUN 18  CREATININE 1.14*  CALCIUM 9.0  AST 33  ALT 17  ALKPHOS 54  BILITOT 0.8   ------------------------------------------------------------------------------------------------------------------  Cardiac Enzymes No results for input(s): TROPONINI in the last 168 hours. ------------------------------------------------------------------------------------------------------------------  RADIOLOGY:  CT Head Wo Contrast  Result Date: 07/15/2019 CLINICAL DATA:  85 year old with slurred speech. EXAM: CT HEAD WITHOUT CONTRAST TECHNIQUE: Contiguous axial images were obtained from the base of the skull through the vertex without intravenous contrast. COMPARISON:  Head CT 01/16/2018 FINDINGS: Brain: No intracranial hemorrhage, mass effect, or midline shift. Age related atrophy. No hydrocephalus. The basilar cisterns are  patent. Mild chronic small vessel ischemic change. No evidence of territorial infarct or acute ischemia. No extra-axial or intracranial fluid collection. Vascular: Atherosclerosis of skullbase vasculature without hyperdense vessel or abnormal calcification. Skull: Remote left occipital fracture, unchanged alignment. No acute fracture. No focal bone lesion. Sinuses/Orbits: Paranasal sinuses and mastoid air cells are clear. The visualized orbits are unremarkable. Other: None. IMPRESSION: 1. No acute intracranial  abnormality. 2. Age related atrophy and chronic small vessel ischemia. Electronically Signed   By: Keith Rake M.D.   On: 07/15/2019 22:08      IMPRESSION AND PLAN:   1.  Transient ischemic attack with expressive dysphasia and dysarthria, rule out evolving CVA.The patient will be admitted to an observation medically monitored bed.  We will follow neuro checks q.4 hours for 24 hours.  The patient will be placed on aspirin.  Will obtain a brain MRI without contrast as well as bilateral carotid Doppler and 2D echo with bubble study .   A physical/occupation/speech therapy consults will be obtained in a.m..  The patient will be placed on statin therapy and fasting lipids will be checked.  2.  Hypertensive urgency.  The patient will be placed on as needed IV labetalol with permissive parameters.  Her Avapro will be continued with permissive parameters as well as the rest of her antihypertensives.  3.  Type II diabetes mellitus.  She will be placed on supplement coverage with NovoLog.  4.  Gout.  We will continue her allopurinol.  5.  DVT prophylaxis.  Subtenons Lovenox.  All the records are reviewed and case discussed with ED provider. The plan of care was discussed in details with the patient (and family). I answered all questions. The patient agreed to proceed with the above mentioned plan. Further management will depend upon hospital course.   CODE STATUS: I discussed the CODE STATUS with the patient and she desires to be full code.  Christel Mormon M.D on 07/15/2019 at 11:44 PM  [facility]   From 7 PM-7 AM, contact night-coverage [email or web address]  CC: Primary care physician; Albina Billet, MD   Note: This dictation was prepared with Dragon dictation along with smaller phrase technology. Any transcriptional errors that result from this process are unintentional.

## 2019-07-15 NOTE — ED Triage Notes (Signed)
Pt brought in via ems from home with slurred speech.  No headache.  No dizziness   No chest pain or sob.  Sx for 3 hours approx.  Pt alert.  Pt denies any pain.

## 2019-07-15 NOTE — ED Provider Notes (Signed)
[facility] Emergency Department Provider Note  ____________________________________________   First MD Initiated Contact with Patient 07/15/19 2200     (approximate)  I have reviewed the triage vital signs and the nursing notes.  History  Chief Complaint Aphasia    HPI Amy Aguirre is a 85 y.o. female hx as below, who presents for dysarthria. Patient last seen normal around 4:30 PM by daughter. At 8 PM she was on the phone w/ daughter and noted to be slurring her speech. No aphasia or word finding difficulties. She does sometimes have speech difficulty related to dry mouth, but this episode was much more severe than any time she has had trouble w/ dry mouth. No other associated symptoms - no headache, weakness, facial droop. Patient and daughter feel speech is starting to improve between home and here, as well as during her time in the ED.  She not on anticoagulation. Does have a hx of a fall with SDH in 2019.   Past Medical Hx Past Medical History:  Diagnosis Date  . Diabetes mellitus without complication (HCC)   . HOH (hard of hearing)   . Hypertension   . PONV (postoperative nausea and vomiting)     Problem List Patient Active Problem List   Diagnosis Date Noted  . Hip fx (HCC) 01/16/2018  . Fall 11/10/2017    Past Surgical Hx Past Surgical History:  Procedure Laterality Date  . ABDOMINAL HYSTERECTOMY    . CATARACT EXTRACTION W/PHACO Right 04/18/2015   Procedure: CATARACT EXTRACTION PHACO AND INTRAOCULAR LENS PLACEMENT (IOC);  Surgeon: Galen Manila, MD;  Location: [facility] ORS;  Service: Ophthalmology;  Laterality: Right;  [place]   00:58.9 AP    24.1 CDE   14.19 casette lot#  . CHOLECYSTECTOMY    . GANGLION CYST EXCISION    . INTRAMEDULLARY (IM) NAIL INTERTROCHANTERIC Right 01/17/2018   Procedure: INTRAMEDULLARY (IM) NAIL INTERTROCHANTRIC;  Surgeon: Kennedy Bucker, MD;  Location: [facility] ORS;  Service: Orthopedics;  Laterality: Right;     Medications Prior to Admission medications   Medication Sig Start Date End Date Taking? Authorizing Provider  acetaminophen (TYLENOL) 325 MG tablet Take 2 tablets (650 mg total) by mouth every 6 (six) hours as needed for mild pain (or Fever >/= 101). 01/20/18  Yes Wieting, Richard, MD  allopurinol (ZYLOPRIM) 100 MG tablet Take 100 mg by mouth daily. 06/03/19  Yes [provider]  aspirin EC 81 MG tablet Take 81 mg by mouth at bedtime.   Yes [provider]  atenolol (TENORMIN) 100 MG tablet Take 100 mg by mouth daily.   Yes [provider]  Calcium Carb-Cholecalciferol (CALCIUM+D3 PO) Take 1 tablet by mouth at bedtime. Oscal   Yes [provider]  glipiZIDE (GLUCOTROL) 5 MG tablet Take 0.5 tablets by mouth daily. 04/25/19  Yes [provider]  irbesartan (AVAPRO) 150 MG tablet Take 150 mg by mouth daily. 10/17/17  Yes [provider]  metFORMIN (GLUCOPHAGE) 500 MG tablet Take 1,000 mg by mouth 2 (two) times daily. 04/29/19  Yes [provider]  Multiple Vitamin (MULTIVITAMIN WITH MINERALS) TABS tablet Take 1 tablet by mouth daily. Centrum Silver   Yes [provider]  potassium chloride (KLOR-CON 10) 10 MEQ tablet Take 10 mEq by mouth daily.   Yes [provider]  simvastatin (ZOCOR) 40 MG tablet Take 40 mg by mouth daily.   Yes [provider]  cefUROXime (CEFTIN) 250 MG tablet Take 250 mg by mouth 2 (two) times daily.  06/16/19   [provider]  docusate sodium (COLACE) 100 MG capsule Take 1 capsule (100 mg total) by mouth 2 (two) times daily as needed for moderate constipation. 01/20/18   Alford HighlandWieting, Richard, MD    Allergies Patient has no known allergies.  Family Hx No family history on file.  Social Hx Social History   Tobacco Use  . Smoking status: Never Smoker  . Smokeless tobacco: Never Used  Substance Use Topics  . Alcohol use: No  . Drug use: Not on file     Review of  Systems  Constitutional: Negative for fever, chills. Eyes: Negative for visual changes. ENT: Negative for sore throat. Cardiovascular: Negative for chest pain. Respiratory: Negative for shortness of breath. Gastrointestinal: Negative for nausea, vomiting.  Genitourinary: Negative for dysuria. Musculoskeletal: Negative for leg swelling. Skin: Negative for rash. Neurological: Negative for for headaches. + dysarthria   Physical Exam  Vital Signs: ED Triage Vitals  Enc Vitals Group     BP 07/15/19 2139 (!) 146/72     Pulse Rate 07/15/19 2134 69     Resp 07/15/19 2134 18     Temp 07/15/19 2134 98.3 F (36.8 C)     Temp Source 07/15/19 2134 Oral     SpO2 07/15/19 2134 97 %     Weight 07/15/19 2135 93 lb (42.2 kg)     Height 07/15/19 2135 5\' 2"  (1.575 m)     Head Circumference --      Peak Flow --      Pain Score 07/15/19 2135 0     Pain Loc --      Pain Edu? --      Excl. in GC? --     Constitutional: Alert and oriented.  Head: Normocephalic. Atraumatic. Eyes: Conjunctivae clear. Sclera anicteric. Nose: No congestion. No rhinorrhea. Mouth/Throat: Wearing mask.  Neck: No stridor.   Cardiovascular: Normal rate, regular rhythm. Extremities well perfused. Respiratory: Normal respiratory effort.  Lungs CTAB. Gastrointestinal: Soft. Non-tender. Non-distended.  Musculoskeletal: No lower extremity edema. No deformities. Neurologic:  Slurred speech. No word finding difficulty.  NIHSS 1. Alert and oriented.  Face symmetric.  Tongue midline.  Cranial nerves II through XII intact. UE and LE strength 5/5 and symmetric. UE and LE SILT.  Skin: Skin is warm, dry and intact. No rash noted. Psychiatric: Mood and affect are appropriate for situation.  EKG  N/A    Radiology  CT:  IMPRESSION:  1. No acute intracranial abnormality.  2. Age related atrophy and chronic small vessel ischemia.    Procedures  Procedure(s) performed (including critical care):  .Critical  Care Performed by: Miguel AschoffMonks, Huel Centola L., MD Authorized by: Miguel AschoffMonks, Aundra Espin L., MD   Critical care provider statement:    Critical care time (minutes):  30   Critical care was necessary to treat or prevent imminent or life-threatening deterioration of the following conditions: code stroke.   Critical care was time spent personally by me on the following activities:  Discussions with consultants, evaluation of patient's response to treatment, examination of patient, ordering and performing treatments and interventions, ordering and review of laboratory studies, ordering and review of radiographic studies, pulse oximetry, re-evaluation of patient's condition, obtaining history from patient or surrogate and review of old charts     Initial Impression / Assessment and Plan / ED Course  85 y.o. female who presents to the ED for dysarthria. On initial arrival, patient states symptoms present x 3 hours. Code Stroke activate, however on arrival of daughters, confirmed  LKN 4:30 PM, patient outside window for tPA.   Ddx: TIA, CVA, complex migraine unlikely as no headache, presentation not c/w seizure  On exam, she does have dysarthria, but otherwise remainder of neurological exam is normal, NIHSS 1. Seems to be improving during stay.  Initial CT head negative. Evaluated by Neurologist as patient initially activated as code stroke. Will admit for further TIA/CVA work up.    Final Clinical Impression(s) / ED Diagnosis  Final diagnoses:  Slurred speech       Note:  This document was prepared using Dragon voice recognition software and may include unintentional dictation errors.   Lilia Pro., MD 07/16/19 [phone]

## 2019-07-15 NOTE — ED Notes (Signed)
Dr Joan Mayans in triage room with pt.  Code stroke activated and pt taken to ct scan with this nurse.  Pt alert.  Speech slurred.  Pt to room 16 after scan.

## 2019-07-15 NOTE — ED Notes (Signed)
By the time the pt came to the room from ct there was no slurred speech noted.

## 2019-07-15 NOTE — Progress Notes (Signed)
Ch responded to a code STROKE regarding the pt who is a 86 YOF that is HOH from home that presented to have slurred speak as observed by the pt's dau. Pt was brought in via EMS. Ch provided a calming presence as the pt was assessed by the tele-health care team with ERMD at bedside with the pt's dau to determine the pt's baseline. The pt had a positive affect and shared that she felt fine. The pt's BP was elevated but the writer observed that the pt was able to follow commands well. Ch allowed the tele-health care team to continue assessing the pt and encouraged the dau to ask for a ch to be paged if they have further needs.    07/15/19 2200  Clinical Encounter Type  Visited With Patient and family together;Health care provider  Visit Type Code;ED  Referral From Nurse  Consult/Referral To Chaplain  Spiritual Encounters  Spiritual Needs Emotional  Stress Factors  Patient Stress Factors Health changes  Family Stress Factors None identified

## 2019-07-15 NOTE — Consult Note (Signed)
[facility] TeleNeurology Consult Services   Date of Service:   07/15/2019 22:08:37  Impression:     .  G45.9 - Transient cerebral ischaemic attack, unspecified  Comments/Sign-Out: Patient had transient slurred speech that was worse then her usual concerning for a TIA. She is back to baseline now and outside of the time window for IV alteplase.  Metrics: Last Known Well: 07/15/2019 16:30:00 [facility] Notification Time: 07/15/2019 22:08:37 Arrival Time: 07/15/2019 20:56:00 Stamp Time: 07/15/2019 22:08:37 Time First Login Attempt: 07/15/2019 60:45:40 Video Start Time: 07/15/2019 22:16:24  Symptoms: slurred speech NIHSS Start Assessment Time: 07/15/2019 22:20:15 Patient is not a candidate for Alteplase/Activase. Patient was not deemed candidate for Alteplase/Activase thrombolytics because of Last Well Known Above 4.5 Hours. Video End Time: 07/15/2019 22:34:36  CT head showed no acute hemorrhage or acute core infarct.  Clinical Presentation is not Suggestive of Large Vessel Occlusive Disease  ED Physician notified of diagnostic impression and management plan on 07/15/2019 22:34:35  Our recommendations are outlined below.  Recommendations:     .  Activate Stroke Protocol Admission/Order Set     .  Stroke/Telemetry Floor     .  Neuro Checks     .  Bedside Swallow Eval     .  DVT Prophylaxis     .  IV Fluids, Normal Saline     .  Head of Bed 30 Degrees     .  Euglycemia and Avoid Hyperthermia (PRN Acetaminophen)     .  Initiate Aspirin 81 MG Daily  Routine Consultation with Inhouse Neurology for Follow up Care  Sign Out:     .  Discussed with Emergency Department Provider    ------------------------------------------------------------------------------  History of Present Illness: Patient is a 85 year old Female.  Patient was brought by EMS for symptoms of slurred speech  85 yo F with history of traumatic SDH and SAH, DM and HTN who is  presenting with speech changes. She spoke to her daughter on the phone at 16:30 her speech was normal, then at 19:58 she had abnormal speech on the phone with her other daughter. She gets slurred speech at times with dry mouth that improves with water. No history of seizures.   Past Medical History:     . Hypertension     . Diabetes Mellitus  Anticoagulant use:  No  Antiplatelet use: No    Examination: BP(146/72), Pulse(69), Blood Glucose(203) 1A: Level of Consciousness - Alert; keenly responsive + 0 1B: Ask Month and Age - Both Questions Right + 0 1C: Blink Eyes & Squeeze Hands - Performs Both Tasks + 0 2: Test Horizontal Extraocular Movements - Normal + 0 3: Test Visual Fields - No Visual Loss + 0 4: Test Facial Palsy (Use Grimace if Obtunded) - Normal symmetry + 0 5A: Test Left Arm Motor Drift - No Drift for 10 Seconds + 0 5B: Test Right Arm Motor Drift - No Drift for 10 Seconds + 0 6A: Test Left Leg Motor Drift - No Drift for 5 Seconds + 0 6B: Test Right Leg Motor Drift - No Drift for 5 Seconds + 0 7: Test Limb Ataxia (FNF/Heel-Shin) - No Ataxia + 0 8: Test Sensation - Normal; No sensory loss + 0 9: Test Language/Aphasia - Normal; No aphasia + 0 10: Test Dysarthria - Normal + 0 11: Test Extinction/Inattention - No abnormality + 0  NIHSS Score: 0   Patient/Family was informed the Neurology Consult would happen via TeleHealth consult by way of interactive audio  and video telecommunications and consented to receiving care in this manner.   Due to the immediate potential for life-threatening deterioration due to underlying acute neurologic illness, I spent 30 minutes providing critical care. This time includes time for face to face visit via telemedicine, review of medical records, imaging studies and discussion of findings with providers, the patient and/or family.   Dr Carolin Sicks   [facility] [phone]   Case [id number]

## 2019-07-16 ENCOUNTER — Observation Stay: Payer: Medicare Other

## 2019-07-16 ENCOUNTER — Encounter: Payer: Self-pay | Admitting: Family Medicine

## 2019-07-16 ENCOUNTER — Observation Stay
Admit: 2019-07-16 | Discharge: 2019-07-16 | Disposition: A | Discharge status: Home / Self Care | Payer: Medicare Other | Attending: Family Medicine | Admitting: Family Medicine

## 2019-07-16 DIAGNOSIS — E785 Hyperlipidemia, unspecified: Secondary | ICD-10-CM | POA: Diagnosis not present

## 2019-07-16 DIAGNOSIS — I10 Essential (primary) hypertension: Secondary | ICD-10-CM

## 2019-07-16 DIAGNOSIS — M109 Gout, unspecified: Secondary | ICD-10-CM | POA: Diagnosis present

## 2019-07-16 DIAGNOSIS — E119 Type 2 diabetes mellitus without complications: Secondary | ICD-10-CM | POA: Diagnosis present

## 2019-07-16 DIAGNOSIS — R29701 NIHSS score 1: Secondary | ICD-10-CM | POA: Diagnosis present

## 2019-07-16 DIAGNOSIS — I639 Cerebral infarction, unspecified: Secondary | ICD-10-CM | POA: Diagnosis present

## 2019-07-16 DIAGNOSIS — G459 Transient cerebral ischemic attack, unspecified: Secondary | ICD-10-CM | POA: Diagnosis present

## 2019-07-16 DIAGNOSIS — Z20828 Contact with and (suspected) exposure to other viral communicable diseases: Secondary | ICD-10-CM | POA: Diagnosis present

## 2019-07-16 DIAGNOSIS — I160 Hypertensive urgency: Secondary | ICD-10-CM | POA: Diagnosis present

## 2019-07-16 DIAGNOSIS — R4702 Dysphasia: Secondary | ICD-10-CM | POA: Diagnosis present

## 2019-07-16 DIAGNOSIS — Z7984 Long term (current) use of oral hypoglycemic drugs: Secondary | ICD-10-CM | POA: Diagnosis not present

## 2019-07-16 DIAGNOSIS — Z833 Family history of diabetes mellitus: Secondary | ICD-10-CM | POA: Diagnosis not present

## 2019-07-16 DIAGNOSIS — E440 Moderate protein-calorie malnutrition: Secondary | ICD-10-CM

## 2019-07-16 DIAGNOSIS — Z7982 Long term (current) use of aspirin: Secondary | ICD-10-CM | POA: Diagnosis not present

## 2019-07-16 DIAGNOSIS — Z79899 Other long term (current) drug therapy: Secondary | ICD-10-CM | POA: Diagnosis not present

## 2019-07-16 LAB — URINALYSIS, ROUTINE W REFLEX MICROSCOPIC
Bacteria, UA: NONE SEEN
Bilirubin Urine: NEGATIVE
Glucose, UA: NEGATIVE mg/dL
Hgb urine dipstick: NEGATIVE
Ketones, ur: NEGATIVE mg/dL
Nitrite: NEGATIVE
Protein, ur: 30 mg/dL — AB
Specific Gravity, Urine: 1.006 (ref 1.005–1.030)
Squamous Epithelial / HPF: NONE SEEN (ref 0–5)
pH: 6 (ref 5.0–8.0)

## 2019-07-16 LAB — LIPID PANEL
Cholesterol: 116 mg/dL (ref 0–200)
HDL: 61 mg/dL (ref >40)
LDL Cholesterol: 39 mg/dL (ref 0–99)
Total CHOL/HDL Ratio: 1.9 RATIO
Triglycerides: 80 mg/dL (ref <150)
VLDL: 16 mg/dL (ref 0–40)

## 2019-07-16 LAB — GLUCOSE, CAPILLARY
Glucose-Capillary: 131 mg/dL — ABNORMAL HIGH (ref 70–99)
Glucose-Capillary: 147 mg/dL — ABNORMAL HIGH (ref 70–99)
Glucose-Capillary: 191 mg/dL — ABNORMAL HIGH (ref 70–99)
Glucose-Capillary: 210 mg/dL — ABNORMAL HIGH (ref 70–99)
Glucose-Capillary: 220 mg/dL — ABNORMAL HIGH (ref 70–99)
Glucose-Capillary: 85 mg/dL (ref 70–99)

## 2019-07-16 LAB — ECHOCARDIOGRAM COMPLETE
Height: 62 in
Weight: 1488 oz

## 2019-07-16 LAB — SARS CORONAVIRUS 2 (TAT 6-24 HRS): SARS Coronavirus 2: NEGATIVE

## 2019-07-16 LAB — HEMOGLOBIN A1C
Hgb A1c MFr Bld: 6.9 % — ABNORMAL HIGH (ref 4.8–5.6)
Mean Plasma Glucose: 151.33 mg/dL

## 2019-07-16 MED ORDER — SIMVASTATIN 20 MG PO TABS
40.0000 mg | ORAL_TABLET | Freq: Every day | ORAL | Status: DC
  Administered 2019-07-16 – 2019-07-17 (×2): 40 mg via ORAL
  Filled 2019-07-16 (×2): qty 2

## 2019-07-16 MED ORDER — STROKE: EARLY STAGES OF RECOVERY BOOK: Freq: Once | Status: AC

## 2019-07-16 MED ORDER — ENOXAPARIN SODIUM 30 MG/0.3ML ~~LOC~~ SOLN
30.0000 mg | SUBCUTANEOUS | Status: DC
  Administered 2019-07-16: 30 mg via SUBCUTANEOUS
  Filled 2019-07-16: qty 0.3

## 2019-07-16 MED ORDER — ACETAMINOPHEN 160 MG/5ML PO SOLN
650.0000 mg | ORAL | Status: DC | PRN
  Filled 2019-07-16: qty 20.3

## 2019-07-16 MED ORDER — INSULIN ASPART 100 UNIT/ML ~~LOC~~ SOLN: 0.0000 [IU] | Freq: Every day | SUBCUTANEOUS | Status: DC

## 2019-07-16 MED ORDER — ALLOPURINOL 100 MG PO TABS
100.0000 mg | ORAL_TABLET | Freq: Every day | ORAL | Status: DC
  Administered 2019-07-16 – 2019-07-17 (×2): 100 mg via ORAL
  Filled 2019-07-16 (×3): qty 1

## 2019-07-16 MED ORDER — DOCUSATE SODIUM 100 MG PO CAPS: 100.0000 mg | ORAL_CAPSULE | Freq: Two times a day (BID) | ORAL | Status: DC | PRN

## 2019-07-16 MED ORDER — ASPIRIN EC 81 MG PO TBEC: 81.0000 mg | DELAYED_RELEASE_TABLET | Freq: Every day | ORAL | Status: DC

## 2019-07-16 MED ORDER — METFORMIN HCL 500 MG PO TABS
1000.0000 mg | ORAL_TABLET | Freq: Two times a day (BID) | ORAL | Status: DC
  Filled 2019-07-16: qty 2

## 2019-07-16 MED ORDER — ATENOLOL 100 MG PO TABS
100.0000 mg | ORAL_TABLET | Freq: Every day | ORAL | Status: DC
  Administered 2019-07-16 – 2019-07-17 (×2): 100 mg via ORAL
  Filled 2019-07-16 (×2): qty 1

## 2019-07-16 MED ORDER — ADULT MULTIVITAMIN W/MINERALS CH
1.0000 | ORAL_TABLET | Freq: Every day | ORAL | Status: DC
  Administered 2019-07-16 – 2019-07-17 (×2): 1 via ORAL
  Filled 2019-07-16 (×2): qty 1

## 2019-07-16 MED ORDER — ACETAMINOPHEN 650 MG RE SUPP: 650.0000 mg | RECTAL | Status: DC | PRN

## 2019-07-16 MED ORDER — CLOPIDOGREL BISULFATE 75 MG PO TABS
75.0000 mg | ORAL_TABLET | Freq: Every day | ORAL | Status: DC
  Administered 2019-07-16 – 2019-07-17 (×2): 75 mg via ORAL
  Filled 2019-07-16 (×2): qty 1

## 2019-07-16 MED ORDER — SODIUM CHLORIDE 0.9 % IV SOLN: INTRAVENOUS | Status: DC

## 2019-07-16 MED ORDER — GLIPIZIDE 5 MG PO TABS: 2.5000 mg | ORAL_TABLET | Freq: Every day | ORAL | Status: DC

## 2019-07-16 MED ORDER — ACETAMINOPHEN 325 MG PO TABS: 650.0000 mg | ORAL_TABLET | ORAL | Status: DC | PRN

## 2019-07-16 MED ORDER — ENSURE ENLIVE PO LIQD
237.0000 mL | Freq: Two times a day (BID) | ORAL | Status: DC
  Administered 2019-07-16: 14:00:00 237 mL via ORAL

## 2019-07-16 MED ORDER — LABETALOL HCL 5 MG/ML IV SOLN
20.0000 mg | INTRAVENOUS | Status: DC | PRN
  Filled 2019-07-16: qty 4

## 2019-07-16 MED ORDER — GLIPIZIDE 5 MG PO TABS
2.5000 mg | ORAL_TABLET | Freq: Every day | ORAL | Status: DC
  Administered 2019-07-17: 2.5 mg via ORAL
  Filled 2019-07-16 (×2): qty 0.5

## 2019-07-16 MED ORDER — INSULIN ASPART 100 UNIT/ML ~~LOC~~ SOLN: 0.0000 [IU] | Freq: Three times a day (TID) | SUBCUTANEOUS | Status: DC

## 2019-07-16 MED ORDER — ASPIRIN EC 81 MG PO TBEC
81.0000 mg | DELAYED_RELEASE_TABLET | Freq: Every day | ORAL | Status: DC
  Administered 2019-07-16: 81 mg via ORAL
  Filled 2019-07-16: qty 1

## 2019-07-16 MED ORDER — CALCIUM CARBONATE-VITAMIN D 500-200 MG-UNIT PO TABS
ORAL_TABLET | Freq: Every day | ORAL | Status: DC
  Administered 2019-07-16: 1 via ORAL
  Filled 2019-07-16: qty 1

## 2019-07-16 MED ORDER — INSULIN ASPART 100 UNIT/ML ~~LOC~~ SOLN
0.0000 [IU] | SUBCUTANEOUS | Status: DC
  Administered 2019-07-16 (×2): 2 [IU] via SUBCUTANEOUS
  Administered 2019-07-16 – 2019-07-17 (×2): 1 [IU] via SUBCUTANEOUS
  Filled 2019-07-16 (×4): qty 1

## 2019-07-16 MED ORDER — IRBESARTAN 150 MG PO TABS
150.0000 mg | ORAL_TABLET | Freq: Every day | ORAL | Status: DC
  Administered 2019-07-16 – 2019-07-17 (×2): 150 mg via ORAL
  Filled 2019-07-16 (×3): qty 1

## 2019-07-16 MED ORDER — SENNOSIDES-DOCUSATE SODIUM 8.6-50 MG PO TABS
1.0000 | ORAL_TABLET | Freq: Every evening | ORAL | Status: DC | PRN
  Filled 2019-07-16: qty 1

## 2019-07-16 MED ORDER — POTASSIUM CHLORIDE CRYS ER 10 MEQ PO TBCR
10.0000 meq | EXTENDED_RELEASE_TABLET | Freq: Every day | ORAL | Status: DC
  Administered 2019-07-16 – 2019-07-17 (×2): 10 meq via ORAL
  Filled 2019-07-16 (×3): qty 1

## 2019-07-16 MED ORDER — HYDRALAZINE HCL 50 MG PO TABS
25.0000 mg | ORAL_TABLET | Freq: Three times a day (TID) | ORAL | Status: DC
  Administered 2019-07-16 – 2019-07-17 (×4): 25 mg via ORAL
  Filled 2019-07-16 (×4): qty 1

## 2019-07-16 NOTE — Progress Notes (Signed)
*  PRELIMINARY RESULTS* Echocardiogram 2D Echocardiogram has been performed.  Amy Aguirre 07/16/2019, 12:05 PM

## 2019-07-16 NOTE — Evaluation (Signed)
Occupational Therapy Evaluation Patient Details Name: Amy Aguirre MRN: [id number] DOB: [birth year] Today's Date: 07/16/2019    History of Present Illness 85 y.o. pleasant Caucasian female with a known history of type diabetes mellitus and hypertension, who presented to the emergency room with acute onset of slurred speech with expressive dysphasia.  She was last normal at 4:30 PM and 100-hour talk to her over the phone at 8 PM she was having slurred speech and difficulty getting words out.  She denied any headache or dizziness or blurred vision.  No paresthesias or focal muscle weakness.  No fever or chills.  No chest pain or palpitations.  No tinnitus or vertigo no dyspnea or cough or wheezing or recent sick exposure to COVID-19. MRI reveals 66mm acute cortical/subcortical L frontal lobe infarct.   Clinical Impression   Pt seen for OT evaluation this date. Prior to hospital admission, pt was independent, living alone, driving, and reports even raked her front yard 2 days prior to admission. Pt has family who lives a few houses down from her. Currently pt demonstrates mild impairments in speech (slightly slurred  Intermittently particularly when talking fast), balance (may benefit from more consistent use of AD for mobility as pt tends to reach out for counter and end of the bed when ambulated), strength globally, and safety awareness. Very mildly impulsive during session requiring occasional verbal cues for safety. Supervision to [facility] for ADL tasks involving sit to stand transfers. Pt would benefit from skilled OT to address noted impairments and functional limitations (see below for any additional details) in order to maximize safety and independence while minimizing falls risk and caregiver burden.  Upon hospital discharge, recommend pt discharge to home with [facility] services and intermittent family assist/supervision (particularly to monitor medication mgt, assist in driving at least initially, and  safety during meal prep and mobility).     Follow Up Recommendations  Supervision - Intermittent;Home health OT    Equipment Recommendations  None recommended by OT    Recommendations for Other Services       Precautions / Restrictions Precautions Precautions: Fall Restrictions Weight Bearing Restrictions: No      Mobility Bed Mobility Overal bed mobility: Modified Independent                Transfers Overall transfer level: Needs assistance Equipment used: None Transfers: Sit to/from Stand Sit to Stand: Min guard         General transfer comment: mildly unsteady upon initial stand    Balance Overall balance assessment: Needs assistance Sitting-balance support: No upper extremity supported;Feet supported Sitting balance-Leahy Scale: Good     Standing balance support: No upper extremity supported;During functional activity Standing balance-Leahy Scale: Fair                             ADL either performed or assessed with clinical judgement   ADL Overall ADL's : Needs assistance/impaired Eating/Feeding: Independent   Grooming: Supervision/safety;Wash/dry hands;Wash/dry face;Oral care;Standing                               Functional mobility during ADLs: Min guard General ADL Comments: generally at supervision to [facility] for sit to stand ADL tasks for safety and mild balance deficits     Vision Baseline Vision/History: Wears glasses Wears Glasses: At all times Patient Visual Report: No change from baseline Vision Assessment?: No apparent visual deficits  Perception     Praxis      Pertinent Vitals/Pain Pain Assessment: No/denies pain     Hand Dominance Right   Extremity/Trunk Assessment Upper Extremity Assessment Upper Extremity Assessment: Overall WFL for tasks assessed;Generalized weakness(grossly 4/5 to 4+/5 bilaterally except R shoulder 4-/5 (pt reports arthritis))   Lower Extremity Assessment Lower  Extremity Assessment: Overall WFL for tasks assessed;Generalized weakness(grossly at least 4/5 bilat)   Cervical / Trunk Assessment Cervical / Trunk Assessment: Normal   Communication Communication Communication: Expressive difficulties(slightly slurred speech at times when speaking quickly- pt reports "pretty close to 100% back to normal but not quite")   Cognition Arousal/Alertness: Awake/alert Behavior During Therapy: (mildly impulsive) Overall Cognitive Status: Impaired/Different from baseline Area of Impairment: Safety/judgement                         Safety/Judgement: Decreased awareness of safety     General Comments: alert and oriented, follows commands well, slightly impulsive with movement, able to correctly respond to 2 situational safety scenarios   General Comments  RN in to take vitals while pt standing at sink for grooming tasks; afterwards IV site bleeding slightly, pt provided with clean paper towel, cotton gown changed, and RN notified promptly    Exercises Other Exercises Other Exercises: Pt instructed in falls prevention, safety   Shoulder Instructions      Home Living Family/patient expects to be discharged to:: Private residence Living Arrangements: Alone Available Help at Discharge: Family;Available PRN/intermittently(family lives very close by) Type of Home: House Home Access: Stairs to enter [employer] of Steps: 2 Entrance Stairs-Rails: Can reach both Home Layout: One level     Bathroom Shower/Tub: Chief Strategy Officer: Standard     Home Equipment: Production assistant, radio - single point;Grab bars - tub/shower          Prior Functioning/Environment Level of Independence: Independent        Comments: Pt indep, mows her yard, raked leaves 2 days before admission, indep with driving, groceries, meal prep, etc. Denies falls in past 12 mo        OT Problem List: Decreased strength;Decreased safety  awareness;Impaired balance (sitting and/or standing)      OT Treatment/Interventions: Self-care/ADL training;Therapeutic exercise;Therapeutic activities;Neuromuscular education;Cognitive remediation/compensation;DME and/or AE instruction;Patient/family education;Balance training    OT Goals(Current goals can be found in the care plan section) Acute Rehab OT Goals Patient Stated Goal: go home OT Goal Formulation: With patient Time For Goal Achievement: 07/30/19 Potential to Achieve Goals: Good ADL Goals Pt Will Perform Lower Body Dressing: with modified independence;sit to/from stand Additional ADL Goal #1: Pt will independently manage medication with no cues for safety required. Additional ADL Goal #2: Pt will correctly respond to >80% of situational safety scenarios.  OT Frequency: Min 1X/week   Barriers to D/C:            Co-evaluation              AM-PAC OT "6 Clicks" Daily Activity     Outcome Measure Help from another person eating meals?: None Help from another person taking care of personal grooming?: None Help from another person toileting, which includes using toliet, bedpan, or urinal?: A Little Help from another person bathing (including washing, rinsing, drying)?: A Little Help from another person to put on and taking off regular upper body clothing?: None Help from another person to put on and taking off regular lower body clothing?: A Little 6 Click Score:  21   End of Session Equipment Utilized During Treatment: Gait belt Nurse Communication: Other (comment)(IV site)  Activity Tolerance: Patient tolerated treatment well Patient left: in bed;with call bell/phone within reach;with bed alarm set  OT Visit Diagnosis: Other abnormalities of gait and mobility (R26.89);Muscle weakness (generalized) (M62.81);Other symptoms and signs involving cognitive function                Time: 1005-1034 OT Time Calculation (min): 29 min Charges:  OT General Charges $OT  Visit: 1 Visit OT Evaluation $OT Eval Low Complexity: 1 Low OT Treatments $Self Care/Home Management : 8-22 mins  Richrd PrimeJamie Stiller, MPH, MS, OTR/L ascom [phone] 07/16/19, 10:59 AM

## 2019-07-16 NOTE — Progress Notes (Signed)
Initial Nutrition Assessment  DOCUMENTATION CODES:   Non-severe (moderate) malnutrition in context of social or environmental circumstances  INTERVENTION:   Ensure Enlive po BID, each supplement provides 350 kcal and 20 grams of protein  MVI daily   Liberalize diet   NUTRITION DIAGNOSIS:   Moderate Malnutrition related to social / environmental circumstances(advanced age) as evidenced by mild fat depletion, moderate muscle depletion, severe muscle depletion.  GOAL:   Patient will meet greater than or equal to 90% of their needs  MONITOR:   PO intake, Supplement acceptance, Labs, Weight trends, Skin, I & O's  REASON FOR ASSESSMENT:   Other (Comment)(Low BMI)    ASSESSMENT:   85 y.o. pleasant Caucasian female with a known history of type diabetes mellitus and hypertension, who presented to the emergency room with acute onset of slurred speech with expressive dysphasia. Pt found to have TIA   Met with pt in room today. Pt reports good appetite and oral intake pta but reports that she does not eat very much at baseline. Pt ate a cup of cereal and 1/2 carton of milk for breakfast today. Pt is anxiously awaiting whether or not she will get to go home. Pt does not drink any supplements at home. Pt is reluctant to try supplements in hospital but did eventually agree to try vanilla Ensure. Pt reports that she is very active at home; pt states that she still mows her own yard and weed eats. Per chart, pt is weight stable pta. RD will add supplements and MVI to help pt meet her estimated needs. RD will also liberalize pt's diet as pt is not eating enough to exceed any nutrient limits and a heart healthy diet is restrictive of protein.   Medications reviewed and include: allopurinol, aspirin, oscal w/ D, lovenox, glipizide, insulin, metformin, MVI, KCl  Labs reviewed: creat 1.14(H) Hgb 10.7(L), Hct 31.0(L) cbgs- 131, 191, 210 x 24 hrs AIC 6.9(h)- 12/18  NUTRITION - FOCUSED PHYSICAL  EXAM:    Most Recent Value  Orbital Region  Mild depletion  Upper Arm Region  Mild depletion  Thoracic and Lumbar Region  Mild depletion  Buccal Region  Mild depletion  Temple Region  Moderate depletion  Clavicle Bone Region  Moderate depletion  Clavicle and Acromion Bone Region  Mild depletion  Scapular Bone Region  Mild depletion  Dorsal Hand  Severe depletion  Patellar Region  Severe depletion  Anterior Thigh Region  Severe depletion  Posterior Calf Region  Severe depletion  Edema (RD Assessment)  None  Hair  Reviewed  Eyes  Reviewed  Mouth  Reviewed  Skin  Reviewed  Nails  Reviewed     Diet Order:   Diet Order            Diet Carb Modified Fluid consistency: Thin; Room service appropriate? Yes  Diet effective now             EDUCATION NEEDS:   Education needs have been addressed  Skin:  Skin Assessment: Reviewed RN Assessment(Ecchymosis)  Last BM:  12/17  Height:   Ht Readings from Last 1 Encounters:  07/15/19 '5\' 2"'$  (1.575 m)    Weight:   Wt Readings from Last 1 Encounters:  07/15/19 42.2 kg    Ideal Body Weight:  50 kg  BMI:  Body mass index is 17.01 kg/m.  Estimated Nutritional Needs:   Kcal:  1200-1300kcal/day  Protein:  65-70g/day  Fluid:  1.1L/day  Koleen Distance MS, RD, LDN Pager #[phone] Office#- [phone]  After Hours Pager: [phone]

## 2019-07-16 NOTE — Progress Notes (Addendum)
[facility]  - [place] [facility]   PATIENT NAME: Amy NovakDorene Aguirre    MR#:  [id number]  DATE OF BIRTH:  01/25/1925  SUBJECTIVE:  daughter in the room. Patient came in after family noted slurred speech expressive aphasia on the phone last night.  Daughter feels speech has improved remarkably. Patient requesting to go home. Blood pressure bit on the higher side.  REVIEW OF SYSTEMS:   Review of Systems  Constitutional: Negative for chills, fever and weight loss.  HENT: Negative for ear discharge, ear pain and nosebleeds.   Eyes: Negative for blurred vision, pain and discharge.  Respiratory: Negative for sputum production, shortness of breath, wheezing and stridor.   Cardiovascular: Negative for chest pain, palpitations, orthopnea and PND.  Gastrointestinal: Negative for abdominal pain, diarrhea, nausea and vomiting.  Genitourinary: Negative for frequency and urgency.  Musculoskeletal: Negative for back pain and joint pain.  Neurological: Positive for weakness. Negative for sensory change, speech change and focal weakness.  Psychiatric/Behavioral: Negative for depression and hallucinations. The patient is not nervous/anxious.    Tolerating Diet:yes Tolerating PT: pending  DRUG ALLERGIES:  No Known Allergies  VITALS:  Blood pressure (!) 190/76, pulse 66, temperature 98.3 F (36.8 C), resp. rate 18, height 5\' 2"  (1.575 m), weight 42.2 kg, SpO2 100 %.  PHYSICAL EXAMINATION:   Physical Exam  GENERAL:  85 y.o.-year-old patient lying in the bed with no acute distress. thin EYES: Pupils equal, round, reactive to light and accommodation. No scleral icterus. Extraocular muscles intact.  HEENT: Head atraumatic, normocephalic. Oropharynx and nasopharynx clear.  NECK:  Supple, no jugular venous distention. No thyroid enlargement, no tenderness.  LUNGS: Normal breath sounds bilaterally, no wheezing, rales, rhonchi. No use of accessory muscles of respiration.   CARDIOVASCULAR: S1, S2 normal. No murmurs, rubs, or gallops.  ABDOMEN: Soft, nontender, nondistended. Bowel sounds present. No organomegaly or mass.  EXTREMITIES: No cyanosis, clubbing or edema b/l.    NEUROLOGIC: Cranial nerves II through XII are intact. No focal Motor or sensory deficits b/l.   PSYCHIATRIC:  patient is alert and oriented x 3.  SKIN: No obvious rash, lesion, or ulcer.   LABORATORY PANEL:  CBC Recent Labs  Lab 07/15/19 2144  WBC 7.7  HGB 10.7*  HCT 31.0*  PLT 209    Chemistries  Recent Labs  Lab 07/15/19 2144  NA 138  K 4.1  CL 103  CO2 20*  GLUCOSE 211*  BUN 18  CREATININE 1.14*  CALCIUM 9.0  AST 33  ALT 17  ALKPHOS 54  BILITOT 0.8   Cardiac Enzymes No results for input(s): TROPONINI in the last 168 hours. RADIOLOGY:  CT Head Wo Contrast  Result Date: 07/15/2019 CLINICAL DATA:  85 year old with slurred speech. EXAM: CT HEAD WITHOUT CONTRAST TECHNIQUE: Contiguous axial images were obtained from the base of the skull through the vertex without intravenous contrast. COMPARISON:  Head CT 01/16/2018 FINDINGS: Brain: No intracranial hemorrhage, mass effect, or midline shift. Age related atrophy. No hydrocephalus. The basilar cisterns are patent. Mild chronic small vessel ischemic change. No evidence of territorial infarct or acute ischemia. No extra-axial or intracranial fluid collection. Vascular: Atherosclerosis of skullbase vasculature without hyperdense vessel or abnormal calcification. Skull: Remote left occipital fracture, unchanged alignment. No acute fracture. No focal bone lesion. Sinuses/Orbits: Paranasal sinuses and mastoid air cells are clear. The visualized orbits are unremarkable. Other: None. IMPRESSION: 1. No acute intracranial abnormality. 2. Age related atrophy and chronic small vessel ischemia. Electronically Signed   By: Shawna OrleansMelanie  Sanford M.D.   On: 07/15/2019 22:08   MR BRAIN WO CONTRAST  Result Date: 07/16/2019 CLINICAL DATA:  Slurred  speech, TIA, rule out evolving CVA; neuro deficit, acute, stroke suspected. EXAM: MRI HEAD WITHOUT CONTRAST TECHNIQUE: Multiplanar, multiecho pulse sequences of the brain and surrounding structures were obtained without intravenous contrast. COMPARISON:  Head CT examinations 07/15/2019 and earlier FINDINGS: Brain: 10 mm focus of cortical/subcortical restricted diffusion along the anterior bank of the left precentral sulcus in the left frontal lobe, consistent with acute infarct. Corresponding T2/FLAIR hyperintensity at this site. Small foci of encephalomalacia within the anterior frontal lobes bilaterally, likely posttraumatic. Minimal scattered T2/FLAIR hyperintensity within the cerebral white matter is nonspecific, but consistent with chronic small vessel ischemic disease. Mild generalized parenchymal atrophy. No evidence of intracranial mass. No midline shift or extra-axial fluid collection. No chronic intracranial blood products. Vascular: Flow voids maintained within the proximal large arterial vessels. Skull and upper cervical spine: No focal marrow lesion. Trace anterolisthesis at C2-C3 and C3-C4. Sinuses/Orbits: Visualized orbits demonstrate no acute abnormality. Minimal ethmoid sinus mucosal thickening. No significant mastoid effusion. IMPRESSION: 10 mm acute cortical/subcortical left frontal lobe infarct, as described. Small foci of encephalomalacia within the anterior frontal lobes bilaterally, likely posttraumatic. Background of mild generalized parenchymal atrophy and chronic small vessel ischemic disease. Electronically Signed   By: Jackey Loge DO   On: 07/16/2019 09:43   US Carotid Bilateral (at [facility] and [facility] only)  Result Date: 07/16/2019 CLINICAL DATA:  TIA.  History of hypertension and diabetes. EXAM: BILATERAL CAROTID DUPLEX ULTRASOUND TECHNIQUE: Wallace Cullens scale imaging, color Doppler and duplex ultrasound were performed of bilateral carotid and vertebral arteries in the neck. COMPARISON:  Carotid  Doppler ultrasound-10/11/2014 FINDINGS: Criteria: Quantification of carotid stenosis is based on velocity parameters that correlate the residual internal carotid diameter with NASCET-based stenosis levels, using the diameter of the distal internal carotid lumen as the denominator for stenosis measurement. The following velocity measurements were obtained: RIGHT ICA: 93/30 cm/sec CCA: 51/13 cm/sec SYSTOLIC ICA/CCA RATIO:  1.8 ECA: 60 cm/sec LEFT ICA: 90/25 cm/sec CCA: 51/9 cm/sec SYSTOLIC ICA/CCA RATIO:  1.8 ECA: 50 cm/sec RIGHT CAROTID ARTERY: There is a moderate amount of eccentric mixed echogenic plaque within the right carotid bulb (image 15), extending to involve the origin and proximal aspects of the right internal carotid artery image 22), not resulting in elevated peak systolic velocities within the interrogated course of the right internal carotid artery to suggest a hemodynamically significant stenosis. RIGHT VERTEBRAL ARTERY:  Antegrade flow LEFT CAROTID ARTERY: There is a moderate amount of eccentric echogenic plaque within the left carotid bulb (images 47 and 48), extending to involve the origin and proximal aspects of the left internal carotid artery (image 55), not resulting in elevated peak systolic velocities within the interrogated course of the left internal carotid artery to suggest a hemodynamically significant stenosis. LEFT VERTEBRAL ARTERY:  Antegrade flow IMPRESSION: Moderate amount of bilateral atherosclerotic plaque, not resulting in a hemodynamically significant stenosis within either internal carotid artery. Electronically Signed   By: Simonne Come M.D.   On: 07/16/2019 10:27   ECHOCARDIOGRAM COMPLETE  Result Date: 07/16/2019   ECHOCARDIOGRAM REPORT   Patient Name:   LARANDA BURKEMPER Date of Exam: 07/16/2019 Medical Rec #:  [id number]     Height: Accession #:    [id number]    Weight: Date of Birth:  1925/05/27     BSA: Patient Age:    85 years      BP:  199/79 mmHg Patient Gender:  F             HR:           65 bpm. Exam Location:  [facility] Procedure: 2D Echo, Color Doppler and Cardiac Doppler Indications:     G45.9 TIA  History:         Patient has no prior history of Echocardiogram examinations.                  Risk Factors:Hypertension and Diabetes.  Sonographer:     Humphrey Rolls RDCS (AE) Referring Phys:  [id number] Vernetta Honey MANSY Diagnosing Phys: Arnoldo Hooker MD IMPRESSIONS  1. Left ventricular ejection fraction, by visual estimation, is 70 to 75%. The left ventricle has normal function. There is moderately increased left ventricular hypertrophy.  2. The left ventricle has no regional wall motion abnormalities.  3. Global right ventricle has normal systolic function.The right ventricular size is normal. No increase in right ventricular wall thickness.  4. Left atrial size was mildly dilated.  5. Right atrial size was normal.  6. The mitral valve is normal in structure. Mild to moderate mitral valve regurgitation.  7. The tricuspid valve is normal in structure. Tricuspid valve regurgitation is mild.  8. The aortic valve is normal in structure. Aortic valve regurgitation is not visualized.  9. The pulmonic valve was normal in structure. Pulmonic valve regurgitation is not visualized. FINDINGS  Left Ventricle: Left ventricular ejection fraction, by visual estimation, is 70 to 75%. The left ventricle has normal function. The left ventricle has no regional wall motion abnormalities. There is moderately increased left ventricular hypertrophy. Right Ventricle: The right ventricular size is normal. No increase in right ventricular wall thickness. Global RV systolic function is has normal systolic function. Left Atrium: Left atrial size was mildly dilated. Right Atrium: Right atrial size was normal in size Pericardium: There is no evidence of pericardial effusion. Mitral Valve: The mitral valve is normal in structure. Mild to moderate mitral valve regurgitation. Tricuspid Valve: The tricuspid valve is  normal in structure. Tricuspid valve regurgitation is mild. Aortic Valve: The aortic valve is normal in structure. Aortic valve regurgitation is not visualized. Pulmonic Valve: The pulmonic valve was normal in structure. Pulmonic valve regurgitation is not visualized. Pulmonic regurgitation is not visualized. Aorta: The aortic root, ascending aorta and aortic arch are all structurally normal, with no evidence of dilitation or obstruction. IAS/Shunts: No atrial level shunt detected by color flow Doppler.  Arnoldo Hooker MD Electronically signed by Arnoldo Hooker MD Signature Date/Time: 07/16/2019/1:25:40 PM    Final    ASSESSMENT AND PLAN:  Turquoise Esch  is a 85 y.o. pleasant Caucasian female with a known history of type diabetes mellitus and hypertension, who presented to the emergency room with acute onset of slurred speech with expressive dysphasia.  She was last normal at 4:30 PM and 100-hour talk to her over the phone at 8 PM she was having slurred speech and difficulty getting words out.    1 Acute left frontal CVA.   -follow neuro checks q.4 hours for 24 hours.  -Appreciate Nero consult with Dr. Thad Ranger. Recommends aspirin and Plavix for three weeks --- thereafter Plavix daily  -ultrasound carotid Doppler moderate atherosclerosis. No stenosis -continue statins -physical therapy to see patient -occupational therapy--no OT needs  2.  Hypertensive urgency.  T -he patient will be placed on as needed IV labetalol with permissive parameters. -   continue atenolol, Avapro will be continued with permissive  parameters as well as the rest of her antihypertensives. -Pressure persistently elevated more than 170--- added hydralazine 25 TID  3.  Type II diabetes mellitus.   -She will be placed on supplement coverage with NovoLog. -Continue oral glipizide  -d/c metformin due to creat  4.  Gout.  continue her allopurinol.  5.  DVT prophylaxis.  Subcut Lovenox  Procedures:none Family  communication :with dter in the room Consults :Neurology Discharge Disposition : home CODE STATUS: FULL DVT Prophylaxis :lovenox  TOTAL TIME TAKING CARE OF THIS PATIENT: *40* minutes.  >50% time spent on counselling and coordination of care  POSSIBLE D/C IN *1-2 DAYS, DEPENDING ON CLINICAL CONDITION.  Note: This dictation was prepared with Dragon dictation along with smaller phrase technology. Any transcriptional errors that result from this process are unintentional.  Fritzi Mandes M.D on 07/16/2019 at 3:09 PM  Between 7am to 6pm - Pager - [phone]  After 6pm go to [email or web address]  [facility]   CC: Primary care physician; Albina Billet, MD Patient ID: Ventura Sellers, female   DOB: 02-14-1925, 85 y.o.   MRN: [id number]

## 2019-07-16 NOTE — Plan of Care (Signed)
Pt does not have any deficits currently. Will continue to monitor.

## 2019-07-16 NOTE — Progress Notes (Signed)
Chart reviewed. Attempted to visit Pt. Pt is out of the room for testing at this time. Per charting, speech deficits have resolved. Pt is currently on a heart healthy diet. Likely no ST needs but will reattempt screen at a later time.

## 2019-07-16 NOTE — Progress Notes (Signed)
PHARMACIST - PHYSICIAN COMMUNICATION  CONCERNING:  Enoxaparin (Lovenox) for DVT Prophylaxis   RECOMMENDATION: Patient was prescribed enoxaprin 40mg  q24 hours for VTE prophylaxis.   Filed Weights   07/15/19 2135  Weight: 93 lb (42.2 kg)    Body mass index is 17.01 kg/m.  Estimated Creatinine Clearance: 21 mL/min (A) (by C-G formula based on SCr of 1.14 mg/dL (H)).   Patient is candidate for enoxaparin 30mg  every 24 hours based on CrCl <61ml/min and Weight less then 45kg for   DESCRIPTION: Pharmacy has adjusted enoxaparin dose per [facility] policy.   Patient is now receiving enoxaparin 30mg  every 24 hours.  Pernell Dupre, PharmD, BCPS Clinical Pharmacist 07/16/2019 1:39 AM

## 2019-07-16 NOTE — Progress Notes (Signed)
PT Cancellation Note  Patient Details Name: Amy Aguirre MRN: [id number] DOB: 07/20/1925   Cancelled Treatment:    Reason Eval/Treat Not Completed: Patient at procedure or test/unavailable(Consult received and chart reviewed.  Patient currrently out of room for diagnostic testing/imaging.  Will re-attempt at later time/date as medically appropriate and available.)   Janalee Grobe H. Owens Shark, PT, DPT, NCS 07/16/19, 9:37 AM [phone]

## 2019-07-16 NOTE — Consult Note (Signed)
Referring Physician: Allena Katz    Chief Complaint: Slurred speech  HPI: Amy Aguirre is an 85 y.o. female with a history of HTN or DM who presents after developing slurred speech.  Patient last talked to her daughter at 37 on yesterday.  When she talked to her other daughter at 30 she had slurred speech and difficulty getting her words out.  Speech improved today but not back to baseline.  Initial NIHSS of 0.    Date last known well: Date: 07/15/2019 Time last known well: Time: 16:45 tPA Given: No: Improvement in symptoms  Past Medical History:  Diagnosis Date  . Diabetes mellitus without complication (HCC)   . HOH (hard of hearing)   . Hypertension   . PONV (postoperative nausea and vomiting)     Past Surgical History:  Procedure Laterality Date  . ABDOMINAL HYSTERECTOMY    . CATARACT EXTRACTION W/PHACO Right 04/18/2015   Procedure: CATARACT EXTRACTION PHACO AND INTRAOCULAR LENS PLACEMENT (IOC);  Surgeon: Galen Manila, MD;  Location: [facility] ORS;  Service: Ophthalmology;  Laterality: Right;  [place]   00:58.9 AP    24.1 CDE   14.19 casette lot#  . CHOLECYSTECTOMY    . GANGLION CYST EXCISION    . INTRAMEDULLARY (IM) NAIL INTERTROCHANTERIC Right 01/17/2018   Procedure: INTRAMEDULLARY (IM) NAIL INTERTROCHANTRIC;  Surgeon: Kennedy Bucker, MD;  Location: [facility] ORS;  Service: Orthopedics;  Laterality: Right;    Family history: Two daughters alive and well.  Social History:  reports that she has never smoked. She has never used smokeless tobacco. She reports that she does not drink alcohol or use drugs.  Allergies: No Known Allergies  Medications:  I have reviewed the patient's current medications. Prior to Admission:  Medications Prior to Admission  Medication Sig Dispense Refill Last Dose  . acetaminophen (TYLENOL) 325 MG tablet Take 2 tablets (650 mg total) by mouth every 6 (six) hours as needed for mild pain (or Fever >/= 101).   prn at prn  . allopurinol (ZYLOPRIM) 100 MG tablet  Take 100 mg by mouth daily.   07/15/2019 at unknown  . aspirin EC 81 MG tablet Take 81 mg by mouth at bedtime.   07/15/2019 at unknown  . atenolol (TENORMIN) 100 MG tablet Take 100 mg by mouth daily.   07/15/2019 at unknown  . Calcium Carb-Cholecalciferol (CALCIUM+D3 PO) Take 1 tablet by mouth at bedtime. [place]   07/15/2019 at unknown  . glipiZIDE (GLUCOTROL) 5 MG tablet Take 0.5 tablets by mouth daily.   07/15/2019 at unknown  . irbesartan (AVAPRO) 150 MG tablet Take 150 mg by mouth daily.  5 07/15/2019 at unknown  . metFORMIN (GLUCOPHAGE) 500 MG tablet Take 1,000 mg by mouth 2 (two) times daily.   07/15/2019 at unknown  . Multiple Vitamin (MULTIVITAMIN WITH MINERALS) TABS tablet Take 1 tablet by mouth daily. Centrum Silver   07/15/2019 at unknown  . potassium chloride (KLOR-CON 10) 10 MEQ tablet Take 10 mEq by mouth daily.   07/15/2019 at unknown  . simvastatin (ZOCOR) 40 MG tablet Take 40 mg by mouth daily.   07/15/2019 at unknown  . cefUROXime (CEFTIN) 250 MG tablet Take 250 mg by mouth 2 (two) times daily.   Completed Course at Unknown time  . docusate sodium (COLACE) 100 MG capsule Take 1 capsule (100 mg total) by mouth 2 (two) times daily as needed for moderate constipation. 60 capsule 0 prn at prn   Scheduled: . allopurinol  100 mg Oral Daily  . aspirin  81  mg Oral Once  . aspirin EC  81 mg Oral QHS  . atenolol  100 mg Oral Daily  . calcium-vitamin D   Oral QHS  . enoxaparin (LOVENOX) injection  30 mg Subcutaneous Q24H  . feeding supplement (ENSURE ENLIVE)  237 mL Oral BID BM  . glipiZIDE  2.5 mg Oral Daily  . hydrALAZINE  25 mg Oral Q8H  . insulin aspart  0-6 Units Subcutaneous Q4H  . irbesartan  150 mg Oral Daily  . metFORMIN  1,000 mg Oral BID WC  . multivitamin with minerals  1 tablet Oral Daily  . potassium chloride  10 mEq Oral Daily  . simvastatin  40 mg Oral Daily    ROS: History obtained from the patient  General ROS: negative for - chills, fatigue, fever, night  sweats, weight gain or weight loss Psychological ROS: negative for - behavioral disorder, hallucinations, memory difficulties, mood swings or suicidal ideation Ophthalmic ROS: negative for - blurry vision, double vision, eye pain or loss of vision ENT ROS: negative for - epistaxis, nasal discharge, oral lesions, sore throat, tinnitus or vertigo Allergy and Immunology ROS: negative for - hives or itchy/watery eyes Hematological and Lymphatic ROS: negative for - bleeding problems, bruising or swollen lymph nodes Endocrine ROS: negative for - galactorrhea, hair pattern changes, polydipsia/polyuria or temperature intolerance Respiratory ROS: negative for - cough, hemoptysis, shortness of breath or wheezing Cardiovascular ROS: negative for - chest pain, dyspnea on exertion, edema or irregular heartbeat Gastrointestinal ROS: negative for - abdominal pain, diarrhea, hematemesis, nausea/vomiting or stool incontinence Genito-Urinary ROS: negative for - dysuria, hematuria, incontinence or urinary frequency/urgency Musculoskeletal ROS: negative for - joint swelling or muscular weakness Neurological ROS: as noted in HPI Dermatological ROS: negative for rash and skin lesion changes  Physical Examination: Blood pressure (!) 190/76, pulse 66, temperature 98.3 F (36.8 C), resp. rate 18, height 5\' 2"  (1.575 m), weight 42.2 kg, SpO2 100 %.  HEENT-  Normocephalic, no lesions, without obvious abnormality.  Normal external eye and conjunctiva.  Normal TM's bilaterally.  Normal auditory canals and external ears. Normal external nose, mucus membranes and septum.  Normal pharynx. Cardiovascular- S1, S2 normal, pulses palpable throughout   Lungs- chest clear, no wheezing, rales, normal symmetric air entry Abdomen- soft, non-tender; bowel sounds normal; no masses,  no organomegaly Extremities- no edema Lymph-no adenopathy palpable Musculoskeletal-arthritic deformities Skin-warm and dry, no hyperpigmentation,  vitiligo, or suspicious lesions  Neurological Examination   Mental Status: Alert, oriented, thought content appropriate.  Speech slurred and nonfluent at times.  Able to follow 3 step commands without difficulty. Cranial Nerves: II: Visual fields grossly normal, pupils equal, round, reactive to light and accommodation III,IV, VI: ptosis not present, extra-ocular motions intact bilaterally V,VII: smile symmetric, facial light touch sensation normal bilaterally VIII: hearing normal bilaterally IX,X: gag reflex present XI: bilateral shoulder shrug XII: midline tongue extension Motor: Right : Upper extremity   5/5    Left:     Upper extremity   5/5  Lower extremity   5/5     Lower extremity   5/5 Tone and bulk:normal tone throughout; no atrophy noted Sensory: Pinprick and light touch intact throughout, bilaterally Deep Tendon Reflexes: Symmetric throughout Plantars: Right: mute   Left: mute Cerebellar: Normal finger-to-nose and normal heel-to-shin testing bilaterally Gait: not tested due to safety concerns    Laboratory Studies:  Basic Metabolic Panel: Recent Labs  Lab 07/15/19 2144  NA 138  K 4.1  CL 103  CO2 20*  GLUCOSE  211*  BUN 18  CREATININE 1.14*  CALCIUM 9.0    Liver Function Tests: Recent Labs  Lab 07/15/19 2144  AST 33  ALT 17  ALKPHOS 54  BILITOT 0.8  PROT 7.0  ALBUMIN 3.7   No results for input(s): LIPASE, AMYLASE in the last 168 hours. No results for input(s): AMMONIA in the last 168 hours.  CBC: Recent Labs  Lab 07/15/19 2144  WBC 7.7  NEUTROABS 5.3  HGB 10.7*  HCT 31.0*  MCV 96.3  PLT 209    Cardiac Enzymes: No results for input(s): CKTOTAL, CKMB, CKMBINDEX, TROPONINI in the last 168 hours.  BNP: Invalid input(s): POCBNP  CBG: Recent Labs  Lab 07/15/19 2213 07/16/19 0350 07/16/19 1013 07/16/19 1213  GLUCAP 203* 131* 191* 210*    Microbiology: Results for orders placed or performed during the hospital encounter of 07/15/19   SARS CORONAVIRUS 2 (TAT 6-24 HRS) Nasopharyngeal Nasopharyngeal Swab     Status: None   Collection Time: 07/15/19 11:01 PM   Specimen: Nasopharyngeal Swab  Result Value Ref Range Status   SARS Coronavirus 2 NEGATIVE NEGATIVE Final    Comment: (NOTE) SARS-CoV-2 target nucleic acids are NOT DETECTED. The SARS-CoV-2 RNA is generally detectable in upper and lower respiratory specimens during the acute phase of infection. Negative results do not preclude SARS-CoV-2 infection, do not rule out co-infections with other pathogens, and should not be used as the sole basis for treatment or other patient management decisions. Negative results must be combined with clinical observations, patient history, and epidemiological information. The expected result is Negative. Fact Sheet for Patients: SugarRoll.be Fact Sheet for Healthcare Providers: https://www.woods-mathews.com/ This test is not yet approved or cleared by the [place] FDA and  has been authorized for detection and/or diagnosis of SARS-CoV-2 by FDA under an Emergency Use Authorization (EUA). This EUA will remain  in effect (meaning this test can be used) for the duration of the COVID-19 declaration under Section 56 4(b)(1) of the Act, 21 U.S.C. section 360bbb-3(b)(1), unless the authorization is terminated or revoked sooner. Performed at [facility], [place] [address] [place], [place] [address]     Coagulation Studies: Recent Labs    07/15/19 11-23-2142  LABPROT 14.0  INR 1.1    Urinalysis:  Recent Labs  Lab 07/16/19 0758  COLORURINE STRAW*  LABSPEC 1.006  PHURINE 6.0  GLUCOSEU NEGATIVE  HGBUR NEGATIVE  BILIRUBINUR NEGATIVE  KETONESUR NEGATIVE  PROTEINUR 30*  NITRITE NEGATIVE  LEUKOCYTESUR TRACE*    Lipid Panel:    Component Value Date/Time   CHOL 116 07/16/2019 0626   TRIG 80 07/16/2019 0626   HDL 61 07/16/2019 0626   CHOLHDL 1.9 07/16/2019 0626   VLDL 16  07/16/2019 0626   LDLCALC 39 07/16/2019 0626    HgbA1C:  Lab Results  Component Value Date   HGBA1C 6.9 (H) 07/16/2019    Urine Drug Screen:  No results found for: LABOPIA, COCAINSCRNUR, LABBENZ, AMPHETMU, THCU, LABBARB  Alcohol Level: No results for input(s): ETH in the last 168 hours.  Other results: EKG: sinus rhythm at 81 bpm.  Imaging: CT Head Wo Contrast  Result Date: 07/15/2019 CLINICAL DATA:  85 year old with slurred speech. EXAM: CT HEAD WITHOUT CONTRAST TECHNIQUE: Contiguous axial images were obtained from the base of the skull through the vertex without intravenous contrast. COMPARISON:  Head CT 01/16/2018 FINDINGS: Brain: No intracranial hemorrhage, mass effect, or midline shift. Age related atrophy. No hydrocephalus. The basilar cisterns are patent. Mild chronic small vessel ischemic change.  No evidence of territorial infarct or acute ischemia. No extra-axial or intracranial fluid collection. Vascular: Atherosclerosis of skullbase vasculature without hyperdense vessel or abnormal calcification. Skull: Remote left occipital fracture, unchanged alignment. No acute fracture. No focal bone lesion. Sinuses/Orbits: Paranasal sinuses and mastoid air cells are clear. The visualized orbits are unremarkable. Other: None. IMPRESSION: 1. No acute intracranial abnormality. 2. Age related atrophy and chronic small vessel ischemia. Electronically Signed   By: Narda Rutherford M.D.   On: 07/15/2019 22:08   MR BRAIN WO CONTRAST  Result Date: 07/16/2019 CLINICAL DATA:  Slurred speech, TIA, rule out evolving CVA; neuro deficit, acute, stroke suspected. EXAM: MRI HEAD WITHOUT CONTRAST TECHNIQUE: Multiplanar, multiecho pulse sequences of the brain and surrounding structures were obtained without intravenous contrast. COMPARISON:  Head CT examinations 07/15/2019 and earlier FINDINGS: Brain: 10 mm focus of cortical/subcortical restricted diffusion along the anterior bank of the left precentral sulcus  in the left frontal lobe, consistent with acute infarct. Corresponding T2/FLAIR hyperintensity at this site. Small foci of encephalomalacia within the anterior frontal lobes bilaterally, likely posttraumatic. Minimal scattered T2/FLAIR hyperintensity within the cerebral white matter is nonspecific, but consistent with chronic small vessel ischemic disease. Mild generalized parenchymal atrophy. No evidence of intracranial mass. No midline shift or extra-axial fluid collection. No chronic intracranial blood products. Vascular: Flow voids maintained within the proximal large arterial vessels. Skull and upper cervical spine: No focal marrow lesion. Trace anterolisthesis at C2-C3 and C3-C4. Sinuses/Orbits: Visualized orbits demonstrate no acute abnormality. Minimal ethmoid sinus mucosal thickening. No significant mastoid effusion. IMPRESSION: 10 mm acute cortical/subcortical left frontal lobe infarct, as described. Small foci of encephalomalacia within the anterior frontal lobes bilaterally, likely posttraumatic. Background of mild generalized parenchymal atrophy and chronic small vessel ischemic disease. Electronically Signed   By: Jackey Loge DO   On: 07/16/2019 09:43   US Carotid Bilateral (at [facility] and [facility] only)  Result Date: 07/16/2019 CLINICAL DATA:  TIA.  History of hypertension and diabetes. EXAM: BILATERAL CAROTID DUPLEX ULTRASOUND TECHNIQUE: Wallace Cullens scale imaging, color Doppler and duplex ultrasound were performed of bilateral carotid and vertebral arteries in the neck. COMPARISON:  Carotid Doppler ultrasound-10/11/2014 FINDINGS: Criteria: Quantification of carotid stenosis is based on velocity parameters that correlate the residual internal carotid diameter with NASCET-based stenosis levels, using the diameter of the distal internal carotid lumen as the denominator for stenosis measurement. The following velocity measurements were obtained: RIGHT ICA: 93/30 cm/sec CCA: 51/13 cm/sec SYSTOLIC ICA/CCA RATIO:   1.8 ECA: 60 cm/sec LEFT ICA: 90/25 cm/sec CCA: 51/9 cm/sec SYSTOLIC ICA/CCA RATIO:  1.8 ECA: 50 cm/sec RIGHT CAROTID ARTERY: There is a moderate amount of eccentric mixed echogenic plaque within the right carotid bulb (image 15), extending to involve the origin and proximal aspects of the right internal carotid artery image 22), not resulting in elevated peak systolic velocities within the interrogated course of the right internal carotid artery to suggest a hemodynamically significant stenosis. RIGHT VERTEBRAL ARTERY:  Antegrade flow LEFT CAROTID ARTERY: There is a moderate amount of eccentric echogenic plaque within the left carotid bulb (images 47 and 48), extending to involve the origin and proximal aspects of the left internal carotid artery (image 55), not resulting in elevated peak systolic velocities within the interrogated course of the left internal carotid artery to suggest a hemodynamically significant stenosis. LEFT VERTEBRAL ARTERY:  Antegrade flow IMPRESSION: Moderate amount of bilateral atherosclerotic plaque, not resulting in a hemodynamically significant stenosis within either internal carotid artery. Electronically Signed   By: Simonne Come  M.D.   On: 07/16/2019 10:27   ECHOCARDIOGRAM COMPLETE  Result Date: 07/16/2019   ECHOCARDIOGRAM REPORT   Patient Name:   Amy Aguirre Date of Exam: 07/16/2019 Medical Rec #:  [id number]     Height: Accession #:    [id number]    Weight: Date of Birth:  1924/12/14     BSA: Patient Age:    89 years      BP:           199/79 mmHg Patient Gender: F             HR:           65 bpm. Exam Location:  [facility] Procedure: 2D Echo, Color Doppler and Cardiac Doppler Indications:     G45.9 TIA  History:         Patient has no prior history of Echocardiogram examinations.                  Risk Factors:Hypertension and Diabetes.  Sonographer:     Humphrey Rolls RDCS (AE) Referring Phys:  [id number] Vernetta Honey MANSY Diagnosing Phys: Arnoldo Hooker MD IMPRESSIONS  1. Left ventricular  ejection fraction, by visual estimation, is 70 to 75%. The left ventricle has normal function. There is moderately increased left ventricular hypertrophy.  2. The left ventricle has no regional wall motion abnormalities.  3. Global right ventricle has normal systolic function.The right ventricular size is normal. No increase in right ventricular wall thickness.  4. Left atrial size was mildly dilated.  5. Right atrial size was normal.  6. The mitral valve is normal in structure. Mild to moderate mitral valve regurgitation.  7. The tricuspid valve is normal in structure. Tricuspid valve regurgitation is mild.  8. The aortic valve is normal in structure. Aortic valve regurgitation is not visualized.  9. The pulmonic valve was normal in structure. Pulmonic valve regurgitation is not visualized. FINDINGS  Left Ventricle: Left ventricular ejection fraction, by visual estimation, is 70 to 75%. The left ventricle has normal function. The left ventricle has no regional wall motion abnormalities. There is moderately increased left ventricular hypertrophy. Right Ventricle: The right ventricular size is normal. No increase in right ventricular wall thickness. Global RV systolic function is has normal systolic function. Left Atrium: Left atrial size was mildly dilated. Right Atrium: Right atrial size was normal in size Pericardium: There is no evidence of pericardial effusion. Mitral Valve: The mitral valve is normal in structure. Mild to moderate mitral valve regurgitation. Tricuspid Valve: The tricuspid valve is normal in structure. Tricuspid valve regurgitation is mild. Aortic Valve: The aortic valve is normal in structure. Aortic valve regurgitation is not visualized. Pulmonic Valve: The pulmonic valve was normal in structure. Pulmonic valve regurgitation is not visualized. Pulmonic regurgitation is not visualized. Aorta: The aortic root, ascending aorta and aortic arch are all structurally normal, with no evidence of  dilitation or obstruction. IAS/Shunts: No atrial level shunt detected by color flow Doppler.  Arnoldo Hooker MD Electronically signed by Arnoldo Hooker MD Signature Date/Time: 07/16/2019/1:25:40 PM    Final     Assessment: 85 y.o. female with a history of HTN or DM who presents after developing slurred speech.  Speech currently improved but not back to baseline.  MRI of the brain reviewed and reveals an acute left frontal infarct.  Etiology likely embolic.  Carotid dopplers show no evidence of hemodynamically significant stenosis.  Echocardiogram shows no cardiac source of emboli with an EF of 70-75%%.  A1c 6.9, LDL  39.  Patient on ASA and statin at home. BP elevated.  Stroke Risk Factors - diabetes mellitus and hypertension  Plan: 1. BP control 2. PT consult, OT consult, Speech consult 3. Prophylactic therapy-Dual antiplatelet therapy with ASA 81mg  and Plavix 75mg  for three weeks with change to Plavix 75mg  daily alone as monotherapy after that time. 4. NPO until RN stroke swallow screen 5. Telemetry monitoring.  If unremarkable during this hospitalization would have evaluated for prolonged cardiac monitoring on an outpatient basis. 6. Frequent neuro checks 7. Continue statin   , MD Neurology [phone] 07/16/2019, 2:46 PM

## 2019-07-16 NOTE — ED Notes (Addendum)
ED TO INPATIENT HANDOFF REPORT  ED Nurse Name and Phone #:   Elijah Birkom RN  [phone]  S Name/Age/Gender Amy Aguirre 85 y.o. female Room/Bed: ED16A/ED16A  Code Status   Code Status: Prior  Home/SNF/Other Home Patient oriented to: self, place, time and situation Is this baseline? Yes   Triage Complete: Triage complete  Chief Complaint TIA (transient ischemic attack) [G45.9]  Triage Note Pt brought in via ems from home with slurred speech.  No headache.  No dizziness   No chest pain or sob.  Sx for 3 hours approx.  Pt alert.  Pt denies any pain.       Allergies No Known Allergies  Level of Care/Admitting Diagnosis ED Disposition    ED Disposition Condition Comment   Admit  Hospital Area: [facility] [[id number]]  Level of Care: Med-Surg [16]  Covid Evaluation: Asymptomatic Screening Protocol (No Symptoms)  Diagnosis: TIA (transient ischemic attack) [191478][167614]  Admitting Physician: Hannah BeatMANSY, JAN A [[id number]]  Attending Physician: Hannah BeatMANSY, JAN A [[id number]]       B Medical/Surgery History Past Medical History:  Diagnosis Date  . Diabetes mellitus without complication (HCC)   . HOH (hard of hearing)   . Hypertension   . PONV (postoperative nausea and vomiting)    Past Surgical History:  Procedure Laterality Date  . ABDOMINAL HYSTERECTOMY    . CATARACT EXTRACTION W/PHACO Right 04/18/2015   Procedure: CATARACT EXTRACTION PHACO AND INTRAOCULAR LENS PLACEMENT (IOC);  Surgeon: Galen ManilaWilliam Porfilio, MD;  Location: [facility] ORS;  Service: Ophthalmology;  Laterality: Right;  US   00:58.9 AP    24.1 CDE   14.19 casette lot#  . CHOLECYSTECTOMY    . GANGLION CYST EXCISION    . INTRAMEDULLARY (IM) NAIL INTERTROCHANTERIC Right 01/17/2018   Procedure: INTRAMEDULLARY (IM) NAIL INTERTROCHANTRIC;  Surgeon: Kennedy BuckerMenz, Michael, MD;  Location: [facility] ORS;  Service: Orthopedics;  Laterality: Right;     A IV Location/Drains/Wounds Patient Lines/Drains/Airways Status   Active  Line/Drains/Airways    Name:   Placement date:   Placement time:   Site:   Days:   Incision - 3 Ports Other (Comment) Right Mid Left   01/17/18    1228     545          Intake/Output Last 24 hours No intake or output data in the 24 hours ending 07/16/19 0108  Labs/Imaging Results for orders placed or performed during the hospital encounter of 07/15/19 (from the past 48 hour(s))  Protime-INR     Status: None   Collection Time: 07/15/19  9:44 PM  Result Value Ref Range   Prothrombin Time 14.0 11.4 - 15.2 seconds   INR 1.1 0.8 - 1.2    Comment: (NOTE) INR goal varies based on device and disease states. Performed at [facility], [address] [place], [place] [address]   APTT     Status: None   Collection Time: 07/15/19  9:44 PM  Result Value Ref Range   aPTT 28 24 - 36 seconds    Comment: Performed at [facility], [address] [place], [place] [address]  CBC     Status: Abnormal   Collection Time: 07/15/19  9:44 PM  Result Value Ref Range   WBC 7.7 4.0 - 10.5 K/uL   RBC 3.22 (L) 3.87 - 5.11 MIL/uL   Hemoglobin 10.7 (L) 12.0 - 15.0 g/dL   HCT 32.431.0 (L) 40.136.0 - 02.746.0 %   MCV 96.3 80.0 - 100.0 fL   MCH 33.2  26.0 - 34.0 pg   MCHC 34.5 30.0 - 36.0 g/dL   RDW 94.5 03.8 - 88.2 %   Platelets 209 150 - 400 K/uL   nRBC 0.0 0.0 - 0.2 %    Comment: Performed at [facility], [address] [place], [place] [address]  Differential     Status: None   Collection Time: 07/15/19  9:44 PM  Result Value Ref Range   Neutrophils Relative % 68 %   Neutro Abs 5.3 1.7 - 7.7 K/uL   Lymphocytes Relative 22 %   Lymphs Abs 1.7 0.7 - 4.0 K/uL   Monocytes Relative 7 %   Monocytes Absolute 0.5 0.1 - 1.0 K/uL   Eosinophils Relative 3 %   Eosinophils Absolute 0.2 0.0 - 0.5 K/uL   Basophils Relative 0 %   Basophils Absolute 0.0 0.0 - 0.1 K/uL   Immature Granulocytes 0 %   Abs Immature Granulocytes 0.03 0.00 - 0.07 K/uL    Comment: Performed at [facility], [address] [place], [place] [address]  Comprehensive metabolic panel     Status: Abnormal   Collection Time: 07/15/19  9:44 PM  Result Value Ref Range   Sodium 138 135 - 145 mmol/L   Potassium 4.1 3.5 - 5.1 mmol/L   Chloride 103 98 - 111 mmol/L   CO2 20 (L) 22 - 32 mmol/L   Glucose, Bld 211 (H) 70 - 99 mg/dL   BUN 18 8 - 23 mg/dL   Creatinine, Ser 5.05 (H) 0.44 - 1.00 mg/dL   Calcium 9.0 8.9 - 69.7 mg/dL   Total Protein 7.0 6.5 - 8.1 g/dL   Albumin 3.7 3.5 - 5.0 g/dL   AST 33 15 - 41 U/L   ALT 17 0 - 44 U/L   Alkaline Phosphatase 54 38 - 126 U/L   Total Bilirubin 0.8 0.3 - 1.2 mg/dL   GFR calc non Af Amer 42 (L) >60 mL/min   GFR calc Af Amer 48 (L) >60 mL/min   Anion gap 15 5 - 15    Comment: Performed at [facility], [address] [place], [place] [address]  Glucose, capillary     Status: Abnormal   Collection Time: 07/15/19 10:13 PM  Result Value Ref Range   Glucose-Capillary 203 (H) 70 - 99 mg/dL   CT Head Wo Contrast  Result Date: 07/15/2019 CLINICAL DATA:  85 year old with slurred speech. EXAM: CT HEAD WITHOUT CONTRAST TECHNIQUE: Contiguous axial images were obtained from the base of the skull through the vertex without intravenous contrast. COMPARISON:  Head CT 01/16/2018 FINDINGS: Brain: No intracranial hemorrhage, mass effect, or midline shift. Age related atrophy. No hydrocephalus. The basilar cisterns are patent. Mild chronic small vessel ischemic change. No evidence of territorial infarct or acute ischemia. No extra-axial or intracranial fluid collection. Vascular: Atherosclerosis of skullbase vasculature without hyperdense vessel or abnormal calcification. Skull: Remote left occipital fracture, unchanged alignment. No acute fracture. No focal bone lesion. Sinuses/Orbits: Paranasal sinuses and mastoid air cells are clear. The visualized orbits are unremarkable. Other: None. IMPRESSION: 1. No acute intracranial abnormality. 2. Age related atrophy and  chronic small vessel ischemia. Electronically Signed   By: Narda Rutherford M.D.   On: 07/15/2019 22:08    Pending Labs Unresulted Labs (From admission, onward)    Start     Ordered   07/15/19 2249  SARS CORONAVIRUS 2 (TAT 6-24 HRS) Nasopharyngeal Nasopharyngeal Swab  (Tier 3 (TAT 6-24 hrs))  ONCE - STAT,  STAT    Question Answer Comment  Is this test for diagnosis or screening Screening   Symptomatic for COVID-19 as defined by CDC No   Hospitalized for COVID-19 No   Admitted to ICU for COVID-19 No   Previously tested for COVID-19 No   Resident in a congregate (group) care setting No   Employed in healthcare setting No   Pregnant No      07/15/19 2249   07/15/19 2152  Urinalysis, Routine w reflex microscopic  ONCE - STAT,   STAT     07/15/19 2152          Vitals/Pain Today's Vitals   07/15/19 2135 07/15/19 2139 07/15/19 2230 07/15/19 2300  BP:  (!) 146/72 (!) 215/100 (!) 212/68  Pulse:   81 71  Resp:   19 (!) 21  Temp:      TempSrc:      SpO2:   98% 99%  Weight: 42.2 kg     Height: 5\' 2"  (1.575 m)     PainSc: 0-No pain       Isolation Precautions No active isolations  Medications Medications  aspirin chewable tablet 81 mg (has no administration in time range)    Mobility walks with device Moderate fall risk   Focused Assessments Neuro Assessment Handoff:  Swallow screen pass? Yes    NIH Stroke Scale ( + Modified Stroke Scale Criteria)  Interval: Initial Level of Consciousness (1a.)   : Alert, keenly responsive LOC Questions (1b. )   +: Answers both questions correctly LOC Commands (1c. )   + : Performs both tasks correctly Best Gaze (2. )  +: Normal Visual (3. )  +: No visual loss Facial Palsy (4. )    : Normal symmetrical movements Motor Arm, Left (5a. )   +: No drift Motor Arm, Right (5b. )   +: No drift Motor Leg, Left (6a. )   +: No drift Motor Leg, Right (6b. )   +: No drift Limb Ataxia (7. ): Absent Sensory (8. )   +: Normal, no sensory  loss Best Language (9. )   +: No aphasia Dysarthria (10. ): Normal Extinction/Inattention (11.)   +: No Abnormality Modified SS Total  +: 0 Complete NIHSS TOTAL: 0 Last date known well: 07/15/19 Last time known well: 1900 Neuro Assessment:   Neuro Checks:   Initial (07/15/19 2204)  Last Documented NIHSS Modified Score: 0 (07/15/19 2204) Has TPA been given? no If patient is a Neuro Trauma and patient is going to OR before floor call report to [facility] nurse: [phone] or [phone]     R Recommendations: See Admitting Provider Note  Report given to:  Myriam Jacobson RN on 1C  Additional Notes:

## 2019-07-17 DIAGNOSIS — E785 Hyperlipidemia, unspecified: Secondary | ICD-10-CM

## 2019-07-17 LAB — GLUCOSE, CAPILLARY
Glucose-Capillary: 121 mg/dL — ABNORMAL HIGH (ref 70–99)
Glucose-Capillary: 162 mg/dL — ABNORMAL HIGH (ref 70–99)
Glucose-Capillary: 94 mg/dL (ref 70–99)

## 2019-07-17 MED ORDER — HYDRALAZINE HCL 50 MG PO TABS: 50.0000 mg | ORAL_TABLET | Freq: Two times a day (BID) | ORAL | Status: DC

## 2019-07-17 MED ORDER — HYDRALAZINE HCL 50 MG PO TABS: 50.0000 mg | ORAL_TABLET | Freq: Two times a day (BID) | ORAL | 1 refills | Status: AC

## 2019-07-17 MED ORDER — CLOPIDOGREL BISULFATE 75 MG PO TABS: 75.0000 mg | ORAL_TABLET | Freq: Every day | ORAL | 3 refills | Status: AC

## 2019-07-17 NOTE — Plan of Care (Signed)
  Problem: Education: Goal: Knowledge of General Education information will improve Description: Including pain rating scale, medication(s)/side effects and non-pharmacologic comfort measures Outcome: Adequate for Discharge   Problem: Activity: Goal: Risk for activity intolerance will decrease Outcome: Adequate for Discharge   Problem: Pain Managment: Goal: General experience of comfort will improve Outcome: Adequate for Discharge   Problem: Safety: Goal: Ability to remain free from injury will improve Outcome: Adequate for Discharge   Problem: Skin Integrity: Goal: Risk for impaired skin integrity will decrease Outcome: Adequate for Discharge   Problem: Education: Goal: Knowledge of disease or condition will improve Outcome: Adequate for Discharge Goal: Knowledge of secondary prevention will improve Outcome: Adequate for Discharge Goal: Knowledge of patient specific risk factors addressed and post discharge goals established will improve Outcome: Adequate for Discharge

## 2019-07-17 NOTE — Evaluation (Signed)
Physical Therapy Evaluation Patient Details Name: Naliya Gish MRN: [id number] DOB: Jan 30, 1925 Today's Date: 07/17/2019   History of Present Illness  Pt is a 85 y.o. female presenting to hospital 12/17 with slurred speech and expressive dysphasia.  MRI showing 10 mm acute cortical/subcortical L frontal lobe infarct.  PMH includes DM, HOH, htn.  Clinical Impression  Prior to hospital admission, pt was independent; lives alone.  Currently pt is modified independent with bed mobility; independent with transfers; independent with ambulation; and modified independent navigating 6 stairs.  No loss of balance noted during session's activities.  B LE strength, light touch, tone, heel to shin coordination, and proprioception intact.  No reports of pain during session.  Occasional difficulty with words noted (pt reports this is much better than when she first came to hospital; MD notified).  Pt appears to be at baseline level of functional mobility (pt reports no mobility and/or balance concerns).  No acute PT needs identified; will sign off.  Please re-consult PT if pt's status changes and acute PT needs are identified.    Follow Up Recommendations No PT follow up    Equipment Recommendations  None recommended by PT    Recommendations for Other Services       Precautions / Restrictions Precautions Precautions: Fall Restrictions Weight Bearing Restrictions: No      Mobility  Bed Mobility Overal bed mobility: Modified Independent             General bed mobility comments: Semi-supine to/from sit without any noted difficulties  Transfers Overall transfer level: Independent Equipment used: None Transfers: Sit to/from Stand Sit to Stand: Independent         General transfer comment: steady safe transfers noted from bed  Ambulation/Gait Ambulation/Gait assistance: Independent Gait Distance (Feet): 240 Feet Assistive device: None Gait Pattern/deviations: WFL(Within Functional  Limits) Gait velocity: mildly decreased   General Gait Details: steady ambulation noted  Stairs Stairs: Yes Stairs assistance: Modified independent (Device/Increase time) Stair Management: One rail Right;Step to pattern;Forwards;One rail Left Number of Stairs: 6 General stair comments: steady safe stairs navigation noted  Wheelchair Mobility    Modified Rankin (Stroke Patients Only)       Balance Overall balance assessment: Modified Independent Sitting-balance support: No upper extremity supported;Feet supported Sitting balance-Leahy Scale: Normal Sitting balance - Comments: steady sitting reaching outside BOS   Standing balance support: No upper extremity supported;During functional activity Standing balance-Leahy Scale: Normal Standing balance comment: no loss of balance with ambulation (and with walking with head turns R/L/up/down, increasing/decreasing speed, and turning 180 degrees and stopping)                             Pertinent Vitals/Pain Pain Assessment: No/denies pain  Vitals (HR and O2 on room air) stable and WFL throughout treatment session.    Home Living Family/patient expects to be discharged to:: Private residence Living Arrangements: Alone Available Help at Discharge: Family;Available PRN/intermittently(family lives close by) Type of Home: House Home Access: Stairs to enter Entrance Stairs-Rails: Left;Right;Can reach both Technical brewer of Steps: 2 Home Layout: One level Home Equipment: Shower seat;Cane - single point;Grab bars - tub/shower      Prior Function Level of Independence: Independent         Comments: Pt reports no falls in past 6 months.  Occasionally will use cane in community.     Hand Dominance   Dominant Hand: Right    Extremity/Trunk Assessment   Upper  Extremity Assessment Upper Extremity Assessment: Defer to OT evaluation    Lower Extremity Assessment Lower Extremity Assessment: Overall WFL for  tasks assessed    Cervical / Trunk Assessment Cervical / Trunk Assessment: Normal  Communication   Communication: Expressive difficulties(Occasional difficulty with words noted (MD notified))  Cognition Arousal/Alertness: Awake/alert Behavior During Therapy: WFL for tasks assessed/performed Overall Cognitive Status: Within Functional Limits for tasks assessed                                        General Comments   Pt agreeable to PT session.    Exercises     Assessment/Plan    PT Assessment Patent does not need any further PT services  PT Problem List         PT Treatment Interventions      PT Goals (Current goals can be found in the Care Plan section)  Acute Rehab PT Goals Patient Stated Goal: to go home PT Goal Formulation: With patient Time For Goal Achievement: 07/31/19 Potential to Achieve Goals: Good    Frequency     Barriers to discharge        Co-evaluation               AM-PAC PT "6 Clicks" Mobility  Outcome Measure Help needed turning from your back to your side while in a flat bed without using bedrails?: None Help needed moving from lying on your back to sitting on the side of a flat bed without using bedrails?: None Help needed moving to and from a bed to a chair (including a wheelchair)?: None Help needed standing up from a chair using your arms (e.g., wheelchair or bedside chair)?: None Help needed to walk in hospital room?: None Help needed climbing 3-5 steps with a railing? : None 6 Click Score: 24    End of Session Equipment Utilized During Treatment: Gait belt Activity Tolerance: Patient tolerated treatment well Patient left: in bed;with call bell/phone within reach(Fall risk score 8:  no bed alarm needed) Nurse Communication: Mobility status;Precautions PT Visit Diagnosis: Muscle weakness (generalized) (M62.81)    Time: 1700-1749 PT Time Calculation (min) (ACUTE ONLY): 14 min   Charges:   PT Evaluation $PT  Eval Low Complexity: 1 Low          Avery Eustice, PT 07/17/19, 10:36 AM

## 2019-07-17 NOTE — Progress Notes (Signed)
Received order in [facility] to discharge pt home today; verbally reviewed AVS with pt and pt's daughter at bedside; Rxs escribed to [facility] in [place], [place]; pt to call for follow up appointments with Dr D. Tate in 1 weeks and to [facility] Dr Jefm Bryant for appt for holter monitor ( Dr Posey Pronto discussed with pt's daughter at the nurse's station) and hand wrote on the AVS; no questions voiced at this time; pt discharged via wheelchair by nursing to the visitor's entrance

## 2019-07-17 NOTE — Plan of Care (Signed)
Pt currently does not show any neuro dificits. Will continue to monitor.

## 2019-07-17 NOTE — Discharge Summary (Signed)
[facility] [place] [facility]   PATIENT NAME: Amy NovakDorene Aguirre    MR#:  [id number]  DATE OF BIRTH:  03/22/1925  DATE OF ADMISSION:  07/15/2019 ADMITTING PHYSICIAN: Hannah BeatJan A Mansy, MD  DATE OF DISCHARGE: 07/17/2019  PRIMARY CARE PHYSICIAN: Jaclyn Shaggyate, Denny C, MD    ADMISSION DIAGNOSIS:  TIA (transient ischemic attack) [G45.9] Slurred speech [R47.81] Acute CVA (cerebrovascular accident) (HCC) [I63.9]  DISCHARGE DIAGNOSIS:  acute left frontal CVA Hypertension-malignant  SECONDARY DIAGNOSIS:   Past Medical History:  Diagnosis Date  . Diabetes mellitus without complication (HCC)   . HOH (hard of hearing)   . Hypertension   . PONV (postoperative nausea and vomiting)     HOSPITAL COURSE:  Amy Aguirre a89 y.o.pleasant Caucasian femalewith a known history of type diabetes mellitus and hypertension, who presented to the emergency room with acute onset of slurred speech with expressive dysphasia. She was last normal at 4:30 PM and 100-hour talk to her over the phone at 8 PM she was having slurred speech and difficulty getting words out.   1 Acute left frontal CVA.  -follow neuro checks q.4 hours for 24 hours-- no neural deficit -[facility] Dr. Thad Rangereynolds. Recommends aspirin and Plavix for three weeks --- thereafter Plavix daily  -ultrasound carotid Doppler moderate atherosclerosis. No stenosis -continue statins -physical therapy to see patient this morning -occupational therapy--no OT needs  2. Hypertension, uncontrolledT -he patient will be placed on as needed IV labetalol with permissive parameters. - continue atenolol, Avapro will be continued with permissive parameters  -Pressure persistently elevated more than 170--- added hydralazine 25 TID-- change to 50 mg BID -blood pressure this morning 142/63 -patient denies headache or any visual disturbance  3. Type II diabetes mellitus.  -She will be placed on supplement coverage  with NovoLog. -Continue oral glipizide  -d/c metformin due to creat  4. Gout. continue her allopurinol.  5. DVT prophylaxis.Subcut Lovenox  Overall seems to be doing well. Discussed with daughter Elease Hashimotoatricia discharge plan. Will discharge later home. Daughter recommended to call primary care physician and make follow-up appointment.  Procedures:none Family communication : daughter on the phone  consults :Neurology Discharge Disposition : home CODE STATUS: FULL DVT Prophylaxis :lovenox  CONSULTS OBTAINED:  Treatment Team:  Thana Farreynolds, Leslie, MD K[facility], Neuro1, MD  DRUG ALLERGIES:  No Known Allergies  DISCHARGE MEDICATIONS:   Allergies as of 07/17/2019   No Known Allergies     Medication List    STOP taking these medications   cefUROXime 250 MG tablet Commonly known as: CEFTIN   metFORMIN 500 MG tablet Commonly known as: GLUCOPHAGE     TAKE these medications   acetaminophen 325 MG tablet Commonly known as: TYLENOL Take 2 tablets (650 mg total) by mouth every 6 (six) hours as needed for mild pain (or Fever >/= 101).   allopurinol 100 MG tablet Commonly known as: ZYLOPRIM Take 100 mg by mouth daily.   aspirin EC 81 MG tablet Take 81 mg by mouth at bedtime. Notes to patient: Stopped taking after three weeks   atenolol 100 MG tablet Commonly known as: TENORMIN Take 100 mg by mouth daily.   CALCIUM+D3 PO Take 1 tablet by mouth at bedtime. Oscal   clopidogrel 75 MG tablet Commonly known as: PLAVIX Take 1 tablet (75 mg total) by mouth daily.   docusate sodium 100 MG capsule Commonly known as: COLACE Take 1 capsule (100 mg total) by mouth 2 (two) times daily as needed for moderate constipation.  glipiZIDE 5 MG tablet Commonly known as: GLUCOTROL Take 0.5 tablets by mouth daily.   hydrALAZINE 50 MG tablet Commonly known as: APRESOLINE Take 1 tablet (50 mg total) by mouth 2 (two) times daily.   irbesartan 150 MG tablet Commonly known as:  AVAPRO Take 150 mg by mouth daily.   Klor-Con 10 10 MEQ tablet Generic drug: potassium chloride Take 10 mEq by mouth daily.   multivitamin with minerals Tabs tablet Take 1 tablet by mouth daily. Centrum Silver   simvastatin 40 MG tablet Commonly known as: ZOCOR Take 40 mg by mouth daily.       If you experience worsening of your admission symptoms, develop shortness of breath, life threatening emergency, suicidal or homicidal thoughts you must seek medical attention immediately by calling 911 or calling your MD immediately  if symptoms less severe.  You Must read complete instructions/literature along with all the possible adverse reactions/side effects for all the Medicines you take and that have been prescribed to you. Take any new Medicines after you have completely understood and accept all the possible adverse reactions/side effects.   Please note  You were cared for by a hospitalist during your hospital stay. If you have any questions about your discharge medications or the care you received while you were in the hospital after you are discharged, you can call the unit and asked to speak with the hospitalist on call if the hospitalist that took care of you is not available. Once you are discharged, your primary care physician will handle any further medical issues. Please note that NO REFILLS for any discharge medications will be authorized once you are discharged, as it is imperative that you return to your primary care physician (or establish a relationship with a primary care physician if you do not have one) for your aftercare needs so that they can reassess your need for medications and monitor your lab values. Today   SUBJECTIVE   No new complaints  VITAL SIGNS:  Blood pressure (!) 142/93, pulse 70, temperature 98.4 F (36.9 C), temperature source Oral, resp. rate 16, height 5\' 2"  (1.575 m), weight 42.2 kg, SpO2 99 %.  I/O:  No intake or output data in the 24 hours  ending 07/17/19 0842  PHYSICAL EXAMINATION:  GENERAL:  85 y.o.-year-old patient lying in the bed with no acute distress.  EYES: Pupils equal, round, reactive to light and accommodation. No scleral icterus. Extraocular muscles intact.  HEENT: Head atraumatic, normocephalic. Oropharynx and nasopharynx clear.  NECK:  Supple, no jugular venous distention. No thyroid enlargement, no tenderness.  LUNGS: Normal breath sounds bilaterally, no wheezing, rales,rhonchi or crepitation. No use of accessory muscles of respiration.  CARDIOVASCULAR: S1, S2 normal. No murmurs, rubs, or gallops.  ABDOMEN: Soft, non-tender, non-distended. Bowel sounds present. No organomegaly or mass.  EXTREMITIES: No pedal edema, cyanosis, or clubbing.  NEUROLOGIC: Cranial nerves II through XII are intact. Muscle strength 5/5 in all extremities. Sensation intact. Gait not checked.  PSYCHIATRIC: The patient is alert and oriented x 3.  SKIN: No obvious rash, lesion, or ulcer.   DATA REVIEW:   CBC  Recent Labs  Lab 07/15/19 2144  WBC 7.7  HGB 10.7*  HCT 31.0*  PLT 209    Chemistries  Recent Labs  Lab 07/15/19 2144  NA 138  K 4.1  CL 103  CO2 20*  GLUCOSE 211*  BUN 18  CREATININE 1.14*  CALCIUM 9.0  AST 33  ALT 17  ALKPHOS 54  BILITOT 0.8  Microbiology Results   Recent Results (from the past 240 hour(s))  SARS CORONAVIRUS 2 (TAT 6-24 HRS) Nasopharyngeal Nasopharyngeal Swab     Status: None   Collection Time: 07/15/19 11:01 PM   Specimen: Nasopharyngeal Swab  Result Value Ref Range Status   SARS Coronavirus 2 NEGATIVE NEGATIVE Final    Comment: (NOTE) SARS-CoV-2 target nucleic acids are NOT DETECTED. The SARS-CoV-2 RNA is generally detectable in upper and lower respiratory specimens during the acute phase of infection. Negative results do not preclude SARS-CoV-2 infection, do not rule out co-infections with other pathogens, and should not be used as the sole basis for treatment or other patient  management decisions. Negative results must be combined with clinical observations, patient history, and epidemiological information. The expected result is Negative. Fact Sheet for Patients: HairSlick.no Fact Sheet for Healthcare Providers: quierodirigir.com This test is not yet approved or cleared by the [place] FDA and  has been authorized for detection and/or diagnosis of SARS-CoV-2 by FDA under an Emergency Use Authorization (EUA). This EUA will remain  in effect (meaning this test can be used) for the duration of the COVID-19 declaration under Section 56 4(b)(1) of the Act, 21 U.S.C. section 360bbb-3(b)(1), unless the authorization is terminated or revoked sooner. Performed at [facility], [address], [place], [place] [address]     RADIOLOGY:  CT Head Wo Contrast  Result Date: 07/15/2019 CLINICAL DATA:  85 year old with slurred speech. EXAM: CT HEAD WITHOUT CONTRAST TECHNIQUE: Contiguous axial images were obtained from the base of the skull through the vertex without intravenous contrast. COMPARISON:  Head CT 01/16/2018 FINDINGS: Brain: No intracranial hemorrhage, mass effect, or midline shift. Age related atrophy. No hydrocephalus. The basilar cisterns are patent. Mild chronic small vessel ischemic change. No evidence of territorial infarct or acute ischemia. No extra-axial or intracranial fluid collection. Vascular: Atherosclerosis of skullbase vasculature without hyperdense vessel or abnormal calcification. Skull: Remote left occipital fracture, unchanged alignment. No acute fracture. No focal bone lesion. Sinuses/Orbits: Paranasal sinuses and mastoid air cells are clear. The visualized orbits are unremarkable. Other: None. IMPRESSION: 1. No acute intracranial abnormality. 2. Age related atrophy and chronic small vessel ischemia. Electronically Signed   By: Narda Rutherford M.D.   On: 07/15/2019 22:08   MR BRAIN  WO CONTRAST  Result Date: 07/16/2019 CLINICAL DATA:  Slurred speech, TIA, rule out evolving CVA; neuro deficit, acute, stroke suspected. EXAM: MRI HEAD WITHOUT CONTRAST TECHNIQUE: Multiplanar, multiecho pulse sequences of the brain and surrounding structures were obtained without intravenous contrast. COMPARISON:  Head CT examinations 07/15/2019 and earlier FINDINGS: Brain: 10 mm focus of cortical/subcortical restricted diffusion along the anterior bank of the left precentral sulcus in the left frontal lobe, consistent with acute infarct. Corresponding T2/FLAIR hyperintensity at this site. Small foci of encephalomalacia within the anterior frontal lobes bilaterally, likely posttraumatic. Minimal scattered T2/FLAIR hyperintensity within the cerebral white matter is nonspecific, but consistent with chronic small vessel ischemic disease. Mild generalized parenchymal atrophy. No evidence of intracranial mass. No midline shift or extra-axial fluid collection. No chronic intracranial blood products. Vascular: Flow voids maintained within the proximal large arterial vessels. Skull and upper cervical spine: No focal marrow lesion. Trace anterolisthesis at C2-C3 and C3-C4. Sinuses/Orbits: Visualized orbits demonstrate no acute abnormality. Minimal ethmoid sinus mucosal thickening. No significant mastoid effusion. IMPRESSION: 10 mm acute cortical/subcortical left frontal lobe infarct, as described. Small foci of encephalomalacia within the anterior frontal lobes bilaterally, likely posttraumatic. Background of mild generalized parenchymal atrophy and chronic small  vessel ischemic disease. Electronically Signed   By: Kellie Simmering DO   On: 07/16/2019 09:43   US Carotid Bilateral (at [facility] and [facility] only)  Result Date: 07/16/2019 CLINICAL DATA:  TIA.  History of hypertension and diabetes. EXAM: BILATERAL CAROTID DUPLEX ULTRASOUND TECHNIQUE: Pearline Cables scale imaging, color Doppler and duplex ultrasound were performed of bilateral  carotid and vertebral arteries in the neck. COMPARISON:  Carotid Doppler ultrasound-10/11/2014 FINDINGS: Criteria: Quantification of carotid stenosis is based on velocity parameters that correlate the residual internal carotid diameter with NASCET-based stenosis levels, using the diameter of the distal internal carotid lumen as the denominator for stenosis measurement. The following velocity measurements were obtained: RIGHT ICA: 93/30 cm/sec CCA: 99/24 cm/sec SYSTOLIC ICA/CCA RATIO:  1.8 ECA: 60 cm/sec LEFT ICA: 90/25 cm/sec CCA: 26/8 cm/sec SYSTOLIC ICA/CCA RATIO:  1.8 ECA: 50 cm/sec RIGHT CAROTID ARTERY: There is a moderate amount of eccentric mixed echogenic plaque within the right carotid bulb (image 15), extending to involve the origin and proximal aspects of the right internal carotid artery image 22), not resulting in elevated peak systolic velocities within the interrogated course of the right internal carotid artery to suggest a hemodynamically significant stenosis. RIGHT VERTEBRAL ARTERY:  Antegrade flow LEFT CAROTID ARTERY: There is a moderate amount of eccentric echogenic plaque within the left carotid bulb (images 47 and 48), extending to involve the origin and proximal aspects of the left internal carotid artery (image 55), not resulting in elevated peak systolic velocities within the interrogated course of the left internal carotid artery to suggest a hemodynamically significant stenosis. LEFT VERTEBRAL ARTERY:  Antegrade flow IMPRESSION: Moderate amount of bilateral atherosclerotic plaque, not resulting in a hemodynamically significant stenosis within either internal carotid artery. Electronically Signed   By: Sandi Mariscal M.D.   On: 07/16/2019 10:27   ECHOCARDIOGRAM COMPLETE  Result Date: 07/16/2019   ECHOCARDIOGRAM REPORT   Patient Name:   Amy Aguirre Date of Exam: 07/16/2019 Medical Rec #:  [id number]     Height: Accession #:    [id number]    Weight: Date of Birth:  Dec 14, 1924     BSA:  Patient Age:    6 years      BP:           199/79 mmHg Patient Gender: F             HR:           65 bpm. Exam Location:  [facility] Procedure: 2D Echo, Color Doppler and Cardiac Doppler Indications:     G45.9 TIA  History:         Patient has no prior history of Echocardiogram examinations.                  Risk Factors:Hypertension and Diabetes.  Sonographer:     Charmayne Sheer RDCS (AE) Referring Phys:  [id number] Lawrence Diagnosing Phys: Serafina Royals MD IMPRESSIONS  1. Left ventricular ejection fraction, by visual estimation, is 70 to 75%. The left ventricle has normal function. There is moderately increased left ventricular hypertrophy.  2. The left ventricle has no regional wall motion abnormalities.  3. Global right ventricle has normal systolic function.The right ventricular size is normal. No increase in right ventricular wall thickness.  4. Left atrial size was mildly dilated.  5. Right atrial size was normal.  6. The mitral valve is normal in structure. Mild to moderate mitral valve regurgitation.  7. The tricuspid valve is normal in structure. Tricuspid valve regurgitation is mild.  8. The aortic valve is normal in structure. Aortic valve regurgitation is not visualized.  9. The pulmonic valve was normal in structure. Pulmonic valve regurgitation is not visualized. FINDINGS  Left Ventricle: Left ventricular ejection fraction, by visual estimation, is 70 to 75%. The left ventricle has normal function. The left ventricle has no regional wall motion abnormalities. There is moderately increased left ventricular hypertrophy. Right Ventricle: The right ventricular size is normal. No increase in right ventricular wall thickness. Global RV systolic function is has normal systolic function. Left Atrium: Left atrial size was mildly dilated. Right Atrium: Right atrial size was normal in size Pericardium: There is no evidence of pericardial effusion. Mitral Valve: The mitral valve is normal in structure. Mild to moderate  mitral valve regurgitation. Tricuspid Valve: The tricuspid valve is normal in structure. Tricuspid valve regurgitation is mild. Aortic Valve: The aortic valve is normal in structure. Aortic valve regurgitation is not visualized. Pulmonic Valve: The pulmonic valve was normal in structure. Pulmonic valve regurgitation is not visualized. Pulmonic regurgitation is not visualized. Aorta: The aortic root, ascending aorta and aortic arch are all structurally normal, with no evidence of dilitation or obstruction. IAS/Shunts: No atrial level shunt detected by color flow Doppler.  Arnoldo Hooker MD Electronically signed by Arnoldo Hooker MD Signature Date/Time: 07/16/2019/1:25:40 PM    Final      CODE STATUS:     Code Status Orders  (From admission, onward)         Start     Ordered   07/16/19 0129  Full code  Continuous     07/16/19 0131        Code Status History    Date Active Date Inactive Code Status Order ID Comments User Context   01/16/2018 2253 01/20/2018 1606 Full Code [id number]  Cammy Copa, MD Inpatient   11/10/2017 2319 11/13/2017 1842 Full Code [id number]  Almond Lint, MD Inpatient   Advance Care Planning Activity       TOTAL TIME TAKING CARE OF THIS PATIENT: **40* minutes.    Enedina Finner M.D on 07/17/2019 at 8:42 AM  Between 7am to 6pm - Pager - [phone] After 6pm go to [email or web address] - password [email or web address]  [facility]    CC: Primary care physician; Jaclyn Shaggy, MD

## 2019-07-17 NOTE — Plan of Care (Signed)
  Problem: Education: Goal: Knowledge of General Education information will improve Description: Including pain rating scale, medication(s)/side effects and non-pharmacologic comfort measures Outcome: Adequate for Discharge   Problem: Activity: Goal: Risk for activity intolerance will decrease Outcome: Adequate for Discharge   Problem: Pain Managment: Goal: General experience of comfort will improve Outcome: Adequate for Discharge   Problem: Safety: Goal: Ability to remain free from injury will improve Outcome: Adequate for Discharge   Problem: Skin Integrity: Goal: Risk for impaired skin integrity will decrease Outcome: Adequate for Discharge   Problem: Education: Goal: Knowledge of disease or condition will improve Outcome: Adequate for Discharge Goal: Knowledge of secondary prevention will improve Outcome: Adequate for Discharge Goal: Knowledge of patient specific risk factors addressed and post discharge goals established will improve Outcome: Adequate for Discharge   

## 2019-08-03 ENCOUNTER — Other Ambulatory Visit: Payer: Medicare Other

## 2020-04-10 ENCOUNTER — Ambulatory Visit: Payer: Medicare Other | Attending: Critical Care Medicine

## 2020-04-10 ENCOUNTER — Ambulatory Visit: Payer: Self-pay

## 2020-04-10 DIAGNOSIS — Z23 Encounter for immunization: Secondary | ICD-10-CM

## 2020-04-10 NOTE — Progress Notes (Signed)
° °  Covid-19 Vaccination Clinic  Name:  Haruka Kowaleski    MRN: [id number] DOB: Feb 02, 1925  04/10/2020  Ms. Aber was observed post Covid-19 immunization for 15 minutes without incident. She was provided with Vaccine Information Sheet and instruction to access the V-Safe system.   Ms. Blish was instructed to call 911 with any severe reactions post vaccine:  Difficulty breathing   Swelling of face and throat   A fast heartbeat   A bad rash all over body   Dizziness and weakness

## 2021-08-18 IMAGING — US US CAROTID DUPLEX BILAT
1 series · 13 of 24 positions shown · non-contrast
Comparison: Carotid Doppler ultrasound-10/11/2014

CLINICAL DATA: TIA.  History of hypertension and diabetes.

EXAM:
BILATERAL CAROTID DUPLEX ULTRASOUND
TECHNIQUE: Gray scale imaging, color Doppler and duplex ultrasound were
performed of bilateral carotid and vertebral arteries in the neck.

[Series 1: us carotid duplex bilat · 13 of 66 slices shown]
[im 1/66]
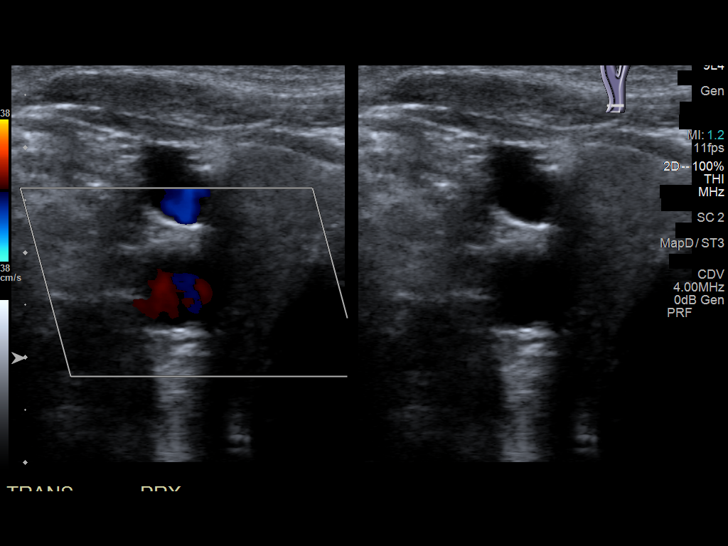
[im 6/66]
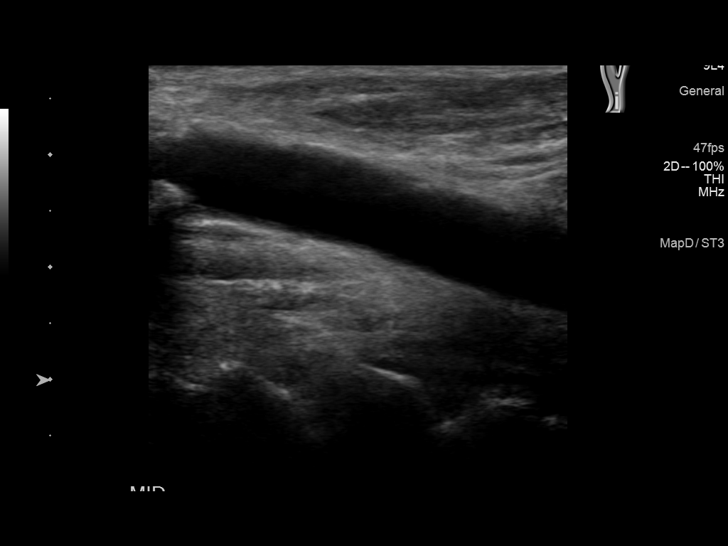
[im 12/66]
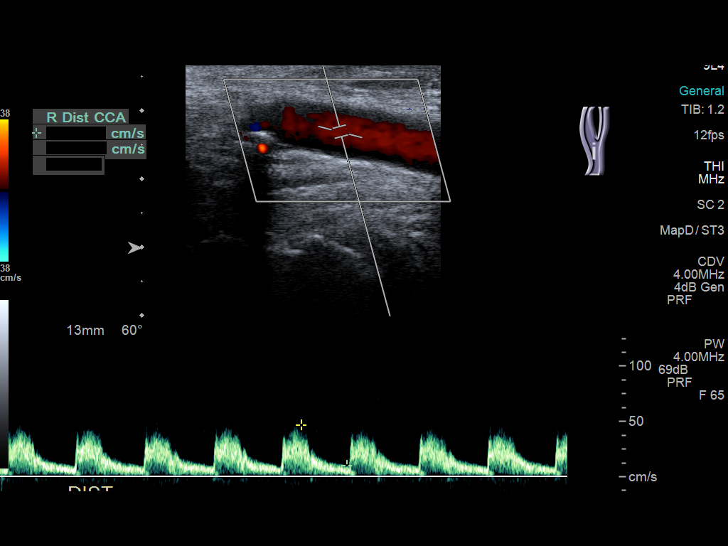
[im 17/66]
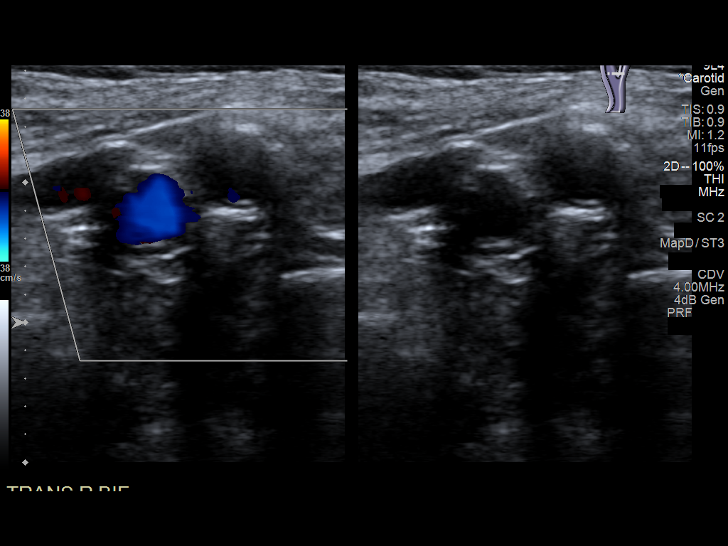
[im 23/66]
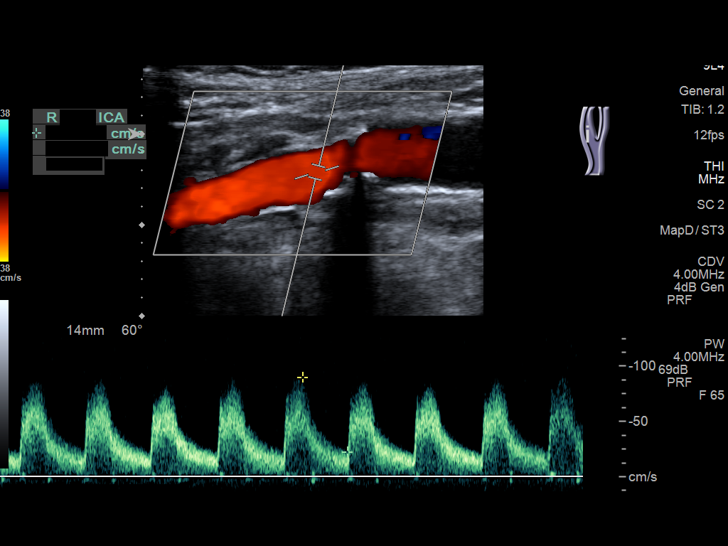
[im 29/66]
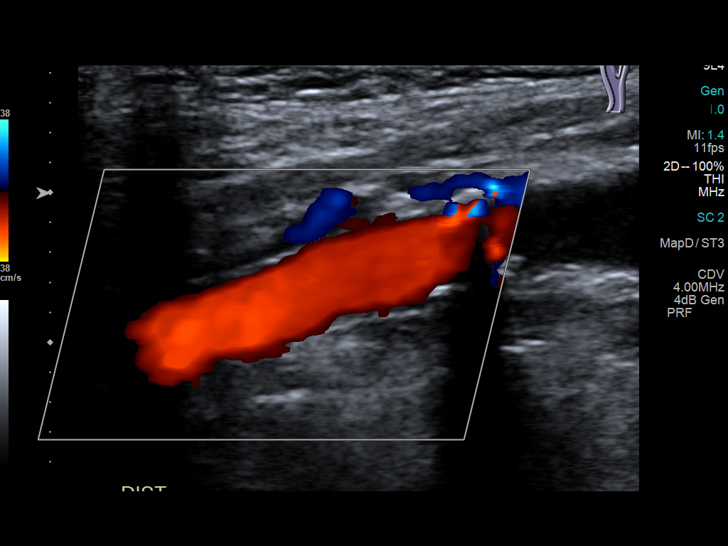
[im 34/66]
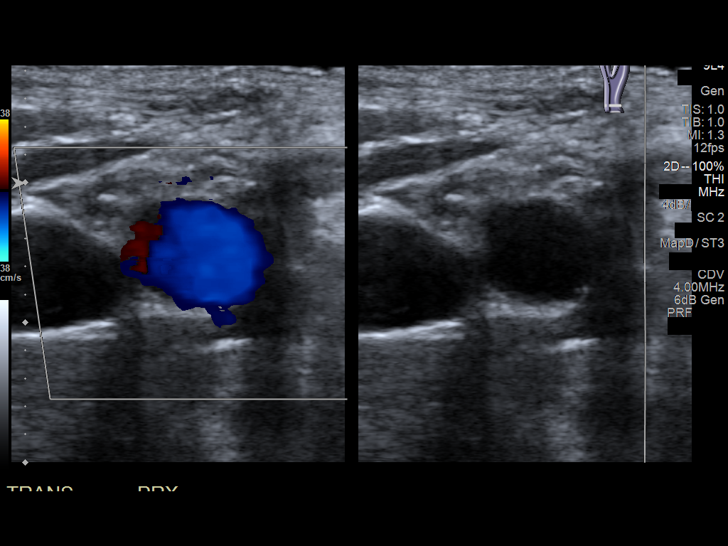
[im 37/66]
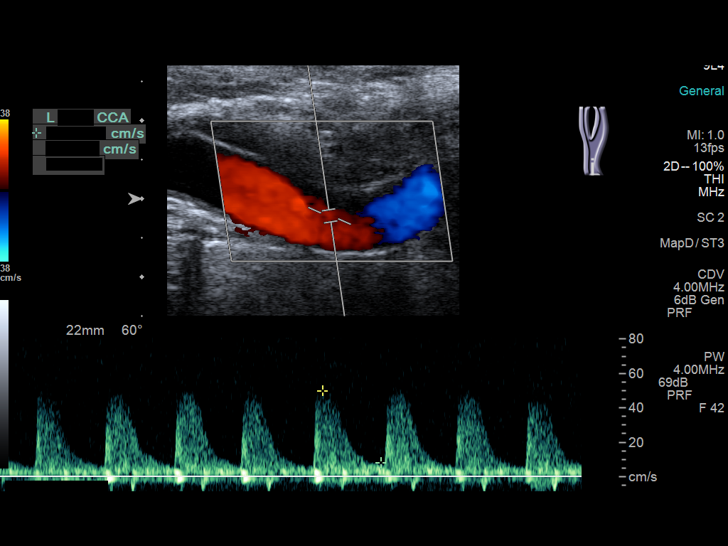
[im 43/66]
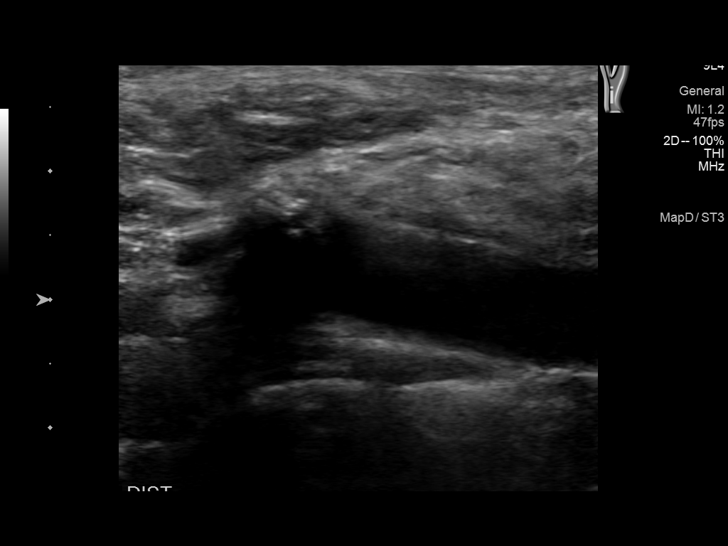
[im 49/66]
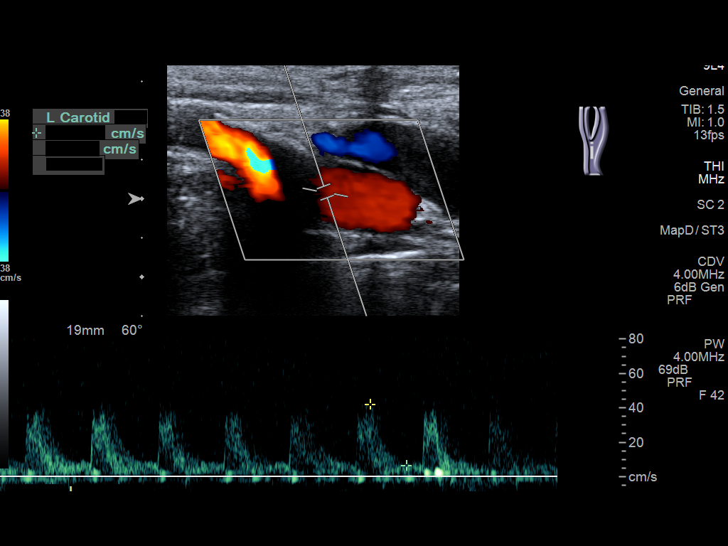
[im 54/66]
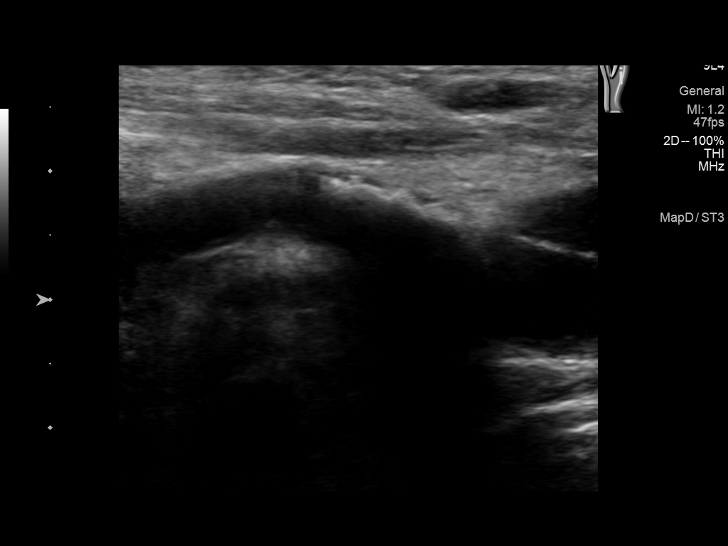
[im 60/66]
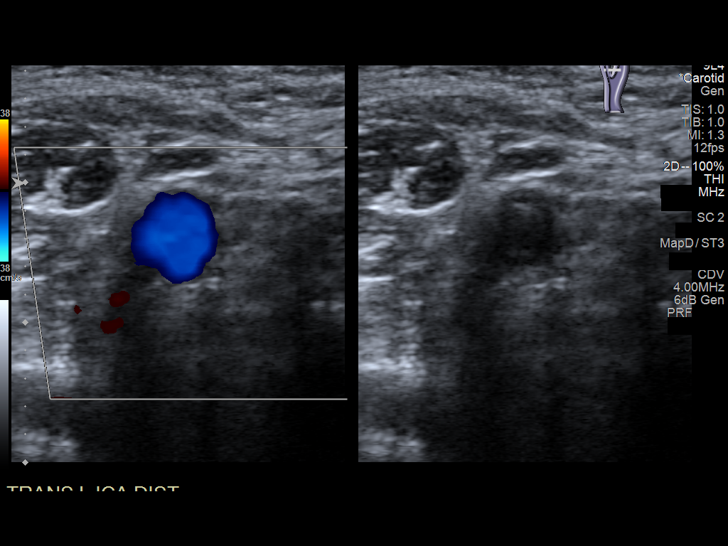
[im 66/66]
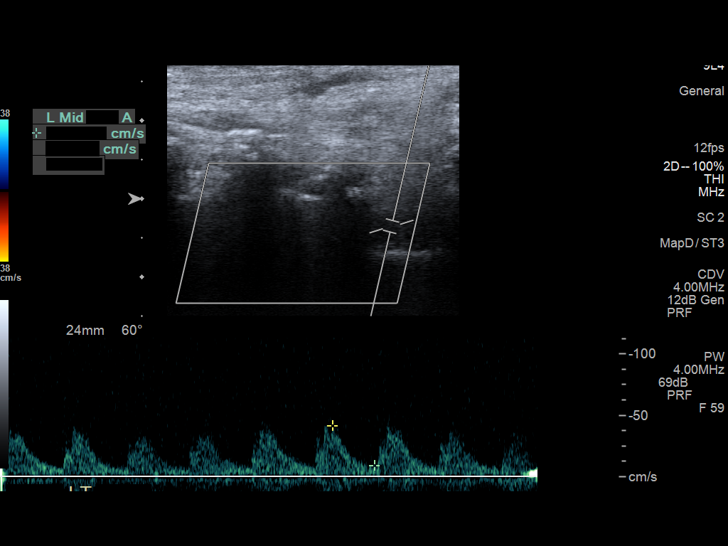

[13 of 24 positions shown; findings below may reference images not displayed]

FINDINGS: Criteria: Quantification of carotid stenosis is based on velocity
parameters that correlate the residual internal carotid diameter
with NASCET-based stenosis levels, using the diameter of the distal
internal carotid lumen as the denominator for stenosis measurement.

The following velocity measurements were obtained:

RIGHT

ICA: 93/30 cm/sec

CCA: 51/13 cm/sec

SYSTOLIC ICA/CCA RATIO:

ECA: 60 cm/sec

LEFT

ICA: 90/25 cm/sec

CCA: 51/9 cm/sec

SYSTOLIC ICA/CCA RATIO:

ECA: 50 cm/sec

RIGHT CAROTID ARTERY: There is a moderate amount of eccentric mixed
echogenic plaque within the right carotid bulb (image 15), extending
to involve the origin and proximal aspects of the right internal
carotid artery image 22), not resulting in elevated peak systolic
velocities within the interrogated course of the right internal
carotid artery to suggest a hemodynamically significant stenosis.

RIGHT VERTEBRAL ARTERY:  Antegrade flow

LEFT CAROTID ARTERY: There is a moderate amount of eccentric
echogenic plaque within the left carotid bulb (images 47 and 48),
extending to involve the origin and proximal aspects of the left
internal carotid artery (image 55), not resulting in elevated peak
systolic velocities within the interrogated course of the left
internal carotid artery to suggest a hemodynamically significant
stenosis.

LEFT VERTEBRAL ARTERY:  Antegrade flow
IMPRESSION: Moderate amount of bilateral atherosclerotic plaque, not resulting
in a hemodynamically significant stenosis within either internal
carotid artery.

## 2022-07-30 ENCOUNTER — Emergency Department
Admission: EM | Admit: 2022-07-30 | Discharge: 2022-08-29 | Disposition: E | Payer: Medicare Other | Attending: Emergency Medicine | Admitting: Emergency Medicine

## 2022-07-30 ENCOUNTER — Other Ambulatory Visit: Payer: Self-pay

## 2022-07-30 DIAGNOSIS — R4182 Altered mental status, unspecified: Secondary | ICD-10-CM | POA: Diagnosis present

## 2022-07-30 DIAGNOSIS — I469 Cardiac arrest, cause unspecified: Secondary | ICD-10-CM

## 2022-07-30 DIAGNOSIS — I3139 Other pericardial effusion (noninflammatory): Secondary | ICD-10-CM | POA: Insufficient documentation

## 2022-07-30 DIAGNOSIS — E119 Type 2 diabetes mellitus without complications: Secondary | ICD-10-CM | POA: Insufficient documentation

## 2022-07-30 DIAGNOSIS — R008 Other abnormalities of heart beat: Secondary | ICD-10-CM | POA: Diagnosis not present

## 2022-07-30 DIAGNOSIS — I959 Hypotension, unspecified: Secondary | ICD-10-CM | POA: Insufficient documentation

## 2022-07-30 DIAGNOSIS — I10 Essential (primary) hypertension: Secondary | ICD-10-CM | POA: Diagnosis not present

## 2022-07-30 LAB — CBC
HCT: 33.4 % — ABNORMAL LOW (ref 36.0–46.0)
Hemoglobin: 10.4 g/dL — ABNORMAL LOW (ref 12.0–15.0)
MCH: 31.5 pg (ref 26.0–34.0)
MCHC: 31.1 g/dL (ref 30.0–36.0)
MCV: 101.2 fL — ABNORMAL HIGH (ref 80.0–100.0)
Platelets: 247 10*3/uL (ref 150–400)
RBC: 3.3 MIL/uL — ABNORMAL LOW (ref 3.87–5.11)
RDW: 14.9 % (ref 11.5–15.5)
WBC: 14 10*3/uL — ABNORMAL HIGH (ref 4.0–10.5)
nRBC: 0 % (ref 0.0–0.2)

## 2022-07-30 LAB — COMPREHENSIVE METABOLIC PANEL
ALT: 29 U/L (ref 0–44)
AST: 126 U/L — ABNORMAL HIGH (ref 15–41)
Albumin: 2.8 g/dL — ABNORMAL LOW (ref 3.5–5.0)
Alkaline Phosphatase: 74 U/L (ref 38–126)
BUN: 26 mg/dL — ABNORMAL HIGH (ref 8–23)
CO2: <7 mmol/L — ABNORMAL LOW (ref 22–32)
Calcium: 7.2 mg/dL — ABNORMAL LOW (ref 8.9–10.3)
Chloride: 105 mmol/L (ref 98–111)
Creatinine, Ser: 1.83 mg/dL — ABNORMAL HIGH (ref 0.44–1.00)
GFR, Estimated: 25 mL/min — ABNORMAL LOW (ref >60)
Glucose, Bld: 99 mg/dL (ref 70–99)
Potassium: 2.4 mmol/L — CL (ref 3.5–5.1)
Sodium: 140 mmol/L (ref 135–145)
Total Bilirubin: 1.3 mg/dL — ABNORMAL HIGH (ref 0.3–1.2)
Total Protein: 6.1 g/dL — ABNORMAL LOW (ref 6.5–8.1)

## 2022-07-30 LAB — CBG MONITORING, ED: Glucose-Capillary: 80 mg/dL (ref 70–99)

## 2022-07-30 MED ORDER — EPINEPHRINE HCL 5 MG/250ML IV SOLN IN NS
INTRAVENOUS | Status: AC
  Filled 2022-07-30: qty 250

## 2022-07-30 MED ORDER — AMIODARONE HCL IN DEXTROSE 360-4.14 MG/200ML-% IV SOLN
INTRAVENOUS | Status: AC
  Filled 2022-07-30: qty 200

## 2022-07-30 MED ORDER — NOREPINEPHRINE 4 MG/250ML-% IV SOLN
INTRAVENOUS | Status: AC
  Filled 2022-07-30: qty 250

## 2022-07-30 MED ORDER — NOREPINEPHRINE 4 MG/250ML-% IV SOLN: 0.0000 ug/min | INTRAVENOUS | Status: DC

## 2022-07-30 MED ORDER — NOREPINEPHRINE 4 MG/250ML-% IV SOLN
INTRAVENOUS | Status: AC
  Administered 2022-07-30: 2 ug/min via INTRAVENOUS
  Filled 2022-07-30: qty 250

## 2022-08-29 NOTE — ED Provider Notes (Signed)
[facility] Provider Note    Event Date/Time   First MD Initiated Contact with Patient 08/10/2022 1452     (approximate)   History   Altered Mental Status   HPI  Amy Aguirre is a 89 y.o. female with past medical history of hypertension diabetes mild aortic stenosis who presents with altered mental status.  Patient unable provide a history because she is critically ill and altered.  Per family she was feeling unwell yesterday decreased appetite diarrhea but then felt better this morning.  They then however found her sitting in her recliner today and was minimally responsive.   Patient lives alone and is independent with her ADLs.  Past Medical History:  Diagnosis Date   Diabetes mellitus without complication (HCC)    HOH (hard of hearing)    Hypertension    PONV (postoperative nausea and vomiting)     Patient Active Problem List   Diagnosis Date Noted   Hyperlipidemia    Acute CVA (cerebrovascular accident) ([place]) 07/16/2019   Malnutrition of moderate degree 07/16/2019   HTN (hypertension), malignant    Type 2 diabetes mellitus without complication, without long-term current use of insulin (HCC)    TIA (transient ischemic attack) 07/15/2019   Hip fx ([place]) 01/16/2018   Fall 11/10/2017     Physical Exam  Triage Vital Signs: ED Triage Vitals  Enc Vitals Group     BP 2022-08-10 1445 (!) 73/46     Pulse Rate Aug 10, 2022 1445 79     Resp 08-10-22 1445 (!) 22     Temp 10-Aug-2022 1446 98.6 F (37 C)     Temp Source 2022-08-10 1446 Rectal     SpO2 Aug 10, 2022 1445 94 %     Weight 2022/08/10 1450 89 lb 8.1 oz (40.6 kg)     Height 08-10-2022 1452 5\' 2"  (1.575 m)     Head Circumference --      Peak Flow --      Pain Score August 10, 2022 1412 0     Pain Loc --      Pain Edu? --      Excl. in [place]? --     Most recent vital signs: Vitals:   08/10/2022 1510 August 10, 2022 1518  BP: (!) 38/28 (!) 61/44  Pulse: (!) 50   Resp: 20   Temp:    SpO2: 93%      General: Patient  is pale, critically ill-appearing CV:  Good peripheral perfusion.  Cool extremities Resp:  Normal effort.  Abd:  No distention.  Soft Neuro:             Patient opens her eyes to voice and moves all of her extremities but does not follow other commands no purposeful Other:    ED Results / Procedures / Treatments  Labs (all labs ordered are listed, but only abnormal results are displayed) Labs Reviewed  COMPREHENSIVE METABOLIC PANEL - Abnormal; Notable for the following components:      Result Value   Potassium 2.4 (*)    CO2 <7 (*)    BUN 26 (*)    Creatinine, Ser 1.83 (*)    Calcium 7.2 (*)    Total Protein 6.1 (*)    Albumin 2.8 (*)    AST 126 (*)    Total Bilirubin 1.3 (*)    GFR, Estimated 25 (*)    All other components within normal limits  CBC - Abnormal; Notable for the following components:   WBC 14.0 (*)  RBC 3.30 (*)    Hemoglobin 10.4 (*)    HCT 33.4 (*)    MCV 101.2 (*)    All other components within normal limits  CBG MONITORING, ED     EKG  EKG showing sinus rhythm with PVCs, minimal ST elevation with inverted T waves in the inferior leads   RADIOLOGY Bedside ultrasound of the heart performed by myself shows complex pericardial effusion with possible right ventricular collapse plethoric IVC with minimal respiratory variation   PROCEDURES:  Critical Care performed: Yes, see critical care procedure note(s)  .Critical Care  Performed by: Rada Hay, MD Authorized by: Rada Hay, MD   Critical care provider statement:    Critical care time (minutes):  30   Critical care was time spent personally by me on the following activities:  Development of treatment plan with patient or surrogate, discussions with consultants, evaluation of patient's response to treatment, examination of patient, ordering and review of laboratory studies, ordering and review of radiographic studies, ordering and performing treatments and interventions, pulse  oximetry, re-evaluation of patient's condition and review of old charts   The patient is on the cardiac monitor to evaluate for evidence of arrhythmia and/or significant heart rate changes.   MEDICATIONS ORDERED IN ED: Medications  norepinephrine (LEVOPHED) 4-5 MG/250ML-% infusion SOLN (has no administration in time range)  norepinephrine (LEVOPHED) 4mg  in 264mL (0.016 mg/mL) premix infusion (20 mcg/min Intravenous Rate/Dose Change 2022-08-26 1516)     IMPRESSION / MDM / ASSESSMENT AND PLAN / ED COURSE  I reviewed the triage vital signs and the nursing notes.                              Patient's presentation is most consistent with acute presentation with potential threat to life or bodily function.  Differential diagnosis includes, but is not limited to, pericardial tamponade, ACS, cardiogenic shock, pulmonary embolism, hypovolemia, sepsis, electrolyte abnormality  The patient is a 89 year old female who presents with altered mental status.  Patient is profoundly hypotensive on arrival, immediately 2 IVs were obtained and patient was given IV fluid fluid bolus.  Continue to be significant hypotensive so I quickly started Levophed infusion.  Performed bedside echo which shows complex pericardial effusion, with questionable diastolic collapse of the RV.  The IVC is also plethoric.  Patient getting fluids wide open.    Family brought immediately to the bedside initially unable to make definitive decision about CODE STATUS as they would like their other sister to be there.  Became profoundly hypotensive bradycardic with minimal spontaneous respirations.  Family not all at bedside and we had discussion about pursuing full aggressive treatment which would require intubation chest compressions and pericardiocentesis and patient's family ultimately opted to pursue comfort care.  Patient was taken off Levophed and passed around 3:30 PM.      FINAL CLINICAL IMPRESSION(S) / ED DIAGNOSES   Final  diagnoses:  PEA (Pulseless electrical activity) ([place])  Altered mental status, unspecified altered mental status type     Rx / DC Orders   ED Discharge Orders     None        Note:  This document was prepared using Dragon voice recognition software and may include unintentional dictation errors.   Rada Hay, MD August 26, 2022 [phone]

## 2022-08-29 NOTE — Progress Notes (Signed)
   08-02-22 1500  Clinical Encounter Type  Visited With Patient and family together  Visit Type Initial  Referral From Nurse  Consult/Referral To Chaplain   Chaplain responded to EOL and code in progress. Chaplain provided compassionate presence and reflective listening as family members were told patient had passed. Chaplain supported family during initial stages of grief and facilitated funeral paperwork to be signed by family. Chaplain services are available for follow up as needed.

## 2022-08-29 NOTE — ED Notes (Signed)
Pt passed away, pronounced by Dr. Starleen Blue at bedside. Family and chaplain at bedside.

## 2022-08-29 NOTE — ED Triage Notes (Signed)
Pt comes via EMs from home with c/o AMS. Pt states this all started today. Pt was fine yesterday and normally ambulates. Pt was slumped in chair. Pt just keeps saying she is hot.  BP-81/49 IV started and fluids given now BP-104/78 HR-70 CBG-155 O2-95 % RA RR-20-30 ETCO2-11  22g LAC, 20g left wrist

## 2022-08-29 DEATH — deceased
